# Patient Record
Sex: Male | Born: 1955 | Race: Black or African American | Hispanic: No
Health system: Southern US, Community
[De-identification: ages and names within clinical notes are randomized; demographics above are authoritative.]

---

## 2003-09-08 ENCOUNTER — Emergency Department (HOSPITAL_COMMUNITY): Admission: EM | Admit: 2003-09-08 | Discharge: 2003-09-08 | Payer: Self-pay | Admitting: Emergency Medicine

## 2003-09-24 ENCOUNTER — Emergency Department (HOSPITAL_COMMUNITY): Admission: AD | Admit: 2003-09-24 | Discharge: 2003-09-24 | Payer: Self-pay | Admitting: Family Medicine

## 2010-07-16 ENCOUNTER — Emergency Department (HOSPITAL_COMMUNITY): Admission: EM | Admit: 2010-07-16 | Discharge: 2010-07-16 | Payer: Self-pay | Admitting: Emergency Medicine

## 2014-11-01 ENCOUNTER — Ambulatory Visit: Payer: Self-pay | Admitting: Family Medicine

## 2014-11-20 ENCOUNTER — Ambulatory Visit: Payer: Self-pay | Admitting: Family Medicine

## 2014-12-05 ENCOUNTER — Ambulatory Visit: Payer: Self-pay | Admitting: Family Medicine

## 2015-12-22 ENCOUNTER — Emergency Department (HOSPITAL_COMMUNITY)
Admission: EM | Admit: 2015-12-22 | Discharge: 2015-12-22 | Disposition: A | Discharge status: Home / Self Care | Payer: Self-pay | Attending: Emergency Medicine | Admitting: Emergency Medicine

## 2015-12-22 ENCOUNTER — Emergency Department (HOSPITAL_COMMUNITY): Payer: Self-pay

## 2015-12-22 ENCOUNTER — Encounter (HOSPITAL_COMMUNITY): Payer: Self-pay | Admitting: Emergency Medicine

## 2015-12-22 DIAGNOSIS — X58XXXA Exposure to other specified factors, initial encounter: Secondary | ICD-10-CM | POA: Insufficient documentation

## 2015-12-22 DIAGNOSIS — F17200 Nicotine dependence, unspecified, uncomplicated: Secondary | ICD-10-CM | POA: Insufficient documentation

## 2015-12-22 DIAGNOSIS — Y9389 Activity, other specified: Secondary | ICD-10-CM | POA: Insufficient documentation

## 2015-12-22 DIAGNOSIS — S62522A Displaced fracture of distal phalanx of left thumb, initial encounter for closed fracture: Secondary | ICD-10-CM | POA: Insufficient documentation

## 2015-12-22 DIAGNOSIS — Y9289 Other specified places as the place of occurrence of the external cause: Secondary | ICD-10-CM | POA: Insufficient documentation

## 2015-12-22 DIAGNOSIS — Y998 Other external cause status: Secondary | ICD-10-CM | POA: Insufficient documentation

## 2015-12-22 MED ORDER — IBUPROFEN 800 MG PO TABS
800.0000 mg | ORAL_TABLET | Freq: Once | ORAL | Status: AC
  Administered 2015-12-22: 800 mg via ORAL
  Filled 2015-12-22: qty 1

## 2015-12-22 MED ORDER — IBUPROFEN 800 MG PO TABS: 800.0000 mg | ORAL_TABLET | Freq: Three times a day (TID) | ORAL | Status: DC

## 2015-12-22 NOTE — ED Provider Notes (Signed)
CSN: [id number]     Arrival date & time 12/22/15  9562 History   First MD Initiated Contact with Patient 12/22/15 0455     Chief Complaint  Patient presents with  . Thumb pain      (Consider location/radiation/quality/duration/timing/severity/associated sxs/prior Treatment) Patient is a 60 y.o. male presenting with extremity pain. The history is provided by the patient.  Extremity Pain This is a new problem. The current episode started 6 to 12 hours ago (was "gettin my drink on" last night, woke up on park bench with painful swollen left thumb). The problem occurs constantly. The problem has not changed since onset.Nothing aggravates the symptoms. Nothing relieves the symptoms. He has tried nothing for the symptoms. The treatment provided no relief.    History reviewed. No pertinent past medical history. History reviewed. No pertinent past surgical history. No family history on file. Social History  Substance Use Topics  . Smoking status: Current Every Day Smoker  . Smokeless tobacco: None  . Alcohol Use: Yes    Review of Systems  All other systems reviewed and are negative.     Allergies  Review of patient's allergies indicates no known allergies.  Home Medications   Prior to Admission medications   Not on File   BP 144/91 mmHg  Pulse 70  Temp(Src) 97.8 F (36.6 C) (Oral)  Resp 18  SpO2 99% Physical Exam  Constitutional: He is oriented to person, place, and time. He appears well-developed and well-nourished. No distress.  HENT:  Head: Normocephalic and atraumatic.  Eyes: Conjunctivae are normal.  Neck: Neck supple. No tracheal deviation present.  Cardiovascular: Normal rate and regular rhythm.   Pulmonary/Chest: Effort normal. No respiratory distress.  Abdominal: Soft. He exhibits no distension.  Musculoskeletal:       Left hand: He exhibits tenderness and swelling. He exhibits no deformity. Normal sensation noted. Normal strength noted.        Hands: Neurological: He is alert and oriented to person, place, and time.  Skin: Skin is warm and dry.  Psychiatric: He has a normal mood and affect.    ED Course  Procedures (including critical care time) Labs Review Labs Reviewed - No data to display  Imaging Review Dg Finger Thumb Left  12/22/2015  CLINICAL DATA:  Acute onset of left thumb pain.  Initial encounter. EXAM: LEFT THUMB 2+V COMPARISON:  None. FINDINGS: There is a comminuted fracture of the first distal phalanx, without significant displacement. Mild soft tissue swelling is noted. This likely extends to the interphalangeal joint. Remaining visualized joint spaces are grossly unremarkable. IMPRESSION: Comminuted fracture of the first distal phalanx, without significant displacement. This likely extends to the interphalangeal joint. Electronically Signed   By: Roanna Raider M.D.   On: 12/22/2015 04:35   I have personally reviewed and evaluated these images and lab results as part of my medical decision-making.   EKG Interpretation None      MDM   Final diagnoses:  Fracture of distal phalanx of left thumb, closed, initial encounter    60 year old male resents after a night of drinking and waking up with acute left thumb pain and he denies remembering any injury to the area but has apparently broken the distal phalanx of his thumb in the affected area. Placed in a splint here with recommendations for ice, elevation, immobilization and follow-up with hand surgery within the week for repeat evaluation. Patient is high risk demographic for narcotic pain medication due to heavy drinking so was provided NSAIDs and  recommended to perform other supportive care measures, he was agreeable to this plan. Return precautions discussed for worsening or new concerning symptoms.   Lyndal Pulleyaniel Kylinn Shropshire, MD 12/22/15 [phone]

## 2015-12-22 NOTE — Discharge Instructions (Signed)
Finger Fracture  Fractures of fingers are breaks in the bones of the fingers. There are many types of fractures. There are different ways of treating these fractures. Your health care provider will discuss the best way to treat your fracture.  CAUSES  Traumatic injury is the main cause of broken fingers. These include:  · Injuries while playing sports.  · Workplace injuries.  · Falls.  RISK FACTORS  Activities that can increase your risk of finger fractures include:  · Sports.  · Workplace activities that involve machinery.  · A condition called osteoporosis, which can make your bones less dense and cause them to fracture more easily.  SIGNS AND SYMPTOMS  The main symptoms of a broken finger are pain and swelling within 15 minutes after the injury. Other symptoms include:  · Bruising of your finger.  · Stiffness of your finger.  · Numbness of your finger.  · Exposed bones (compound fracture) if the fracture is severe.  DIAGNOSIS   The best way to diagnose a broken bone is with X-ray imaging. Additionally, your health care provider will use this X-ray image to evaluate the position of the broken finger bones.   TREATMENT   Finger fractures can be treated with:   · Nonreduction--This means the bones are in place. The finger is splinted without changing the positions of the bone pieces. The splint is usually left on for about a week to 10 days. This will depend on your fracture and what your health care provider thinks.  · Closed reduction--The bones are put back into position without using surgery. The finger is then splinted.  · Open reduction and internal fixation--The fracture site is opened. Then the bone pieces are fixed into place with pins or some type of hardware. This is seldom required. It depends on the severity of the fracture.  HOME CARE INSTRUCTIONS   · Follow your health care provider's instructions regarding activities, exercises, and physical therapy.  · Only take over-the-counter or prescription  medicines for pain, discomfort, or fever as directed by your health care provider.  SEEK MEDICAL CARE IF:  You have pain or swelling that limits the motion or use of your fingers.  SEEK IMMEDIATE MEDICAL CARE IF:   Your finger becomes numb.  MAKE SURE YOU:   · Understand these instructions.  · Will watch your condition.  · Will get help right away if you are not doing well or get worse.     This information is not intended to replace advice given to you by your health care provider. Make sure you discuss any questions you have with your health care provider.     Document Released: 03/08/2001 Document Revised: 09/14/2013 Document Reviewed: 07/06/2013  Elsevier Interactive Patient Education ©2016 [employer]

## 2015-12-22 NOTE — ED Notes (Signed)
Pt transported from park bench after calling 911 for L thumb pain onset 30 min ago, denies trauma

## 2016-01-30 ENCOUNTER — Emergency Department (HOSPITAL_COMMUNITY)
Admission: EM | Admit: 2016-01-30 | Discharge: 2016-01-31 | Disposition: A | Discharge status: Home / Self Care | Payer: Self-pay | Attending: Emergency Medicine | Admitting: Emergency Medicine

## 2016-01-30 ENCOUNTER — Encounter (HOSPITAL_COMMUNITY): Payer: Self-pay

## 2016-01-30 DIAGNOSIS — W08XXXA Fall from other furniture, initial encounter: Secondary | ICD-10-CM | POA: Insufficient documentation

## 2016-01-30 DIAGNOSIS — Y998 Other external cause status: Secondary | ICD-10-CM | POA: Insufficient documentation

## 2016-01-30 DIAGNOSIS — S0181XA Laceration without foreign body of other part of head, initial encounter: Secondary | ICD-10-CM | POA: Insufficient documentation

## 2016-01-30 DIAGNOSIS — Y9289 Other specified places as the place of occurrence of the external cause: Secondary | ICD-10-CM | POA: Insufficient documentation

## 2016-01-30 DIAGNOSIS — F17200 Nicotine dependence, unspecified, uncomplicated: Secondary | ICD-10-CM | POA: Insufficient documentation

## 2016-01-30 DIAGNOSIS — F10920 Alcohol use, unspecified with intoxication, uncomplicated: Secondary | ICD-10-CM | POA: Insufficient documentation

## 2016-01-30 DIAGNOSIS — Y9389 Activity, other specified: Secondary | ICD-10-CM | POA: Insufficient documentation

## 2016-01-30 DIAGNOSIS — W19XXXA Unspecified fall, initial encounter: Secondary | ICD-10-CM

## 2016-01-30 DIAGNOSIS — S199XXA Unspecified injury of neck, initial encounter: Secondary | ICD-10-CM | POA: Insufficient documentation

## 2016-01-30 NOTE — ED Notes (Signed)
Pt here by ems for fall from curb, known etoh use. Per ems 200 cc of blood noted to scene, pt has left ear injury and right forehead lac, pt argumentative on arrival. Pt oriented and alert, pt did have + LOC noted.

## 2016-01-30 NOTE — ED Provider Notes (Signed)
CSN: [id number]     Arrival date & time 01/30/16  2302 History  By signing my name below, I, Freida Busman, attest that this documentation has been prepared under the direction and in the presence of Azalia Bilis, MD . Electronically Signed: Freida Busman, Scribe. 01/30/2016. 11:53 PM.    Chief Complaint  Patient presents with  . Fall   LEVEL 5 CAVEAT DUE TO INEBRIATION  The history is provided by the patient. No language interpreter was used.    HPI Comments:  Robert Calderon is a 60 y.o. male who presents to the Emergency Department s/p fall this evening  complaining of facial abrasions and lacerations following the incident. Per EMS, pt fell from curb; ~200 cc's of blood noted on scene. He had LOC per EMS. Pt admits to having ETOH on board. He denies use of blood thinners.   History reviewed. No pertinent past medical history. History reviewed. No pertinent past surgical history. History reviewed. No pertinent family history. Social History  Substance Use Topics  . Smoking status: Current Every Day Smoker  . Smokeless tobacco: None  . Alcohol Use: Yes    Review of Systems  Unable to perform ROS: Other (Intoxication)   Allergies  Review of patient's allergies indicates no known allergies.  Home Medications   Prior to Admission medications   Medication Sig Start Date End Date Taking? Authorizing Provider  ibuprofen (ADVIL,MOTRIN) 800 MG tablet Take 1 tablet (800 mg total) by mouth 3 (three) times daily. Patient not taking: Reported on 01/31/2016 12/22/15   Lyndal Pulley, MD   BP 116/82 mmHg  Pulse 76  Temp(Src) 97.2 F (36.2 C) (Oral)  Resp 20  Ht  (1.854 m)  Wt 140 lb (63.504 kg)  BMI 18.47 kg/m2  SpO2 97% Physical Exam  Constitutional: He is oriented to person, place, and time. He appears well-developed and well-nourished.  HENT:  Head: Normocephalic and atraumatic.  Eyes: EOM are normal.  Neck:  Immobilized in c-collar There is cervical and paracervical  tenderness without step off  Cardiovascular: Normal rate, regular rhythm, normal heart sounds and intact distal pulses.   Pulmonary/Chest: Effort normal and breath sounds normal. No respiratory distress.  Abdominal: Soft. He exhibits no distension. There is no tenderness.  Musculoskeletal: Normal range of motion.  Nml strength in BUE and BLE  FROM of major joints in both upper and lower extremities bilaterally  Neurological: He is alert and oriented to person, place, and time.  Skin: Skin is warm and dry.  Stellate laceration right lateral forehead without bleeding at this time Small abrasion of ala left ear without active bleeding  Psychiatric: He has a normal mood and affect. Judgment normal.  Nursing note and vitals reviewed.   ED Course  Procedures   DIAGNOSTIC STUDIES:  Oxygen Saturation is 96% on RA, normal by my interpretation.    Labs Review Labs Reviewed  CBC - Abnormal; Notable for the following:    Hemoglobin 11.9 (*)    HCT 35.7 (*)    All other components within normal limits  ETHANOL - Abnormal; Notable for the following:    Alcohol, Ethyl (B) 277 (*)    All other components within normal limits    Imaging Review Ct Head Wo Contrast  01/31/2016  CLINICAL DATA:  Fall from curb with head injury. Left year injury, right forehead laceration. Positive loss of consciousness. EXAM: CT HEAD WITHOUT CONTRAST CT CERVICAL SPINE WITHOUT CONTRAST TECHNIQUE: Multidetector CT imaging of the head and cervical  spine was performed following the standard protocol without intravenous contrast. Multiplanar CT image reconstructions of the cervical spine were also generated. COMPARISON:  None. FINDINGS: CT HEAD FINDINGS No intracranial hemorrhage, mass effect, or midline shift. No hydrocephalus. The basilar cisterns are patent. No evidence of territorial infarct. No intracranial fluid collection. Right frontal scalp laceration without subjacent fracture. Calvarium is intact. Mild mucosal  thickening of the paranasal sinuses without fluid level. Segmental left zygomatic arch fracture, partially included. CT CERVICAL SPINE FINDINGS No fracture or traumatic subluxation. Straightening of normal lordosis. Diffuse degenerative change with disc space narrowing and endplate spurring. Multilevel facet arthropathy, with bony ankylosis of the facets in the upper cervical spine. The dens is intact. C1-C2 degenerative change. No prevertebral soft tissue edema. There are no jumped or perched facets. IMPRESSION: 1. No acute intracranial abnormality. Right frontal scalp laceration without fracture. 2. Segmental fracture of the left zygomatic arch, partially included. 3. Multilevel degenerative change throughout cervical spine without acute fracture or traumatic subluxation. Electronically Signed   By: Rubye Oaks M.D.   On: 01/31/2016 01:26   Ct Cervical Spine Wo Contrast  01/31/2016  CLINICAL DATA:  Fall from curb with head injury. Left year injury, right forehead laceration. Positive loss of consciousness. EXAM: CT HEAD WITHOUT CONTRAST CT CERVICAL SPINE WITHOUT CONTRAST TECHNIQUE: Multidetector CT imaging of the head and cervical spine was performed following the standard protocol without intravenous contrast. Multiplanar CT image reconstructions of the cervical spine were also generated. COMPARISON:  None. FINDINGS: CT HEAD FINDINGS No intracranial hemorrhage, mass effect, or midline shift. No hydrocephalus. The basilar cisterns are patent. No evidence of territorial infarct. No intracranial fluid collection. Right frontal scalp laceration without subjacent fracture. Calvarium is intact. Mild mucosal thickening of the paranasal sinuses without fluid level. Segmental left zygomatic arch fracture, partially included. CT CERVICAL SPINE FINDINGS No fracture or traumatic subluxation. Straightening of normal lordosis. Diffuse degenerative change with disc space narrowing and endplate spurring. Multilevel facet  arthropathy, with bony ankylosis of the facets in the upper cervical spine. The dens is intact. C1-C2 degenerative change. No prevertebral soft tissue edema. There are no jumped or perched facets. IMPRESSION: 1. No acute intracranial abnormality. Right frontal scalp laceration without fracture. 2. Segmental fracture of the left zygomatic arch, partially included. 3. Multilevel degenerative change throughout cervical spine without acute fracture or traumatic subluxation. Electronically Signed   By: Rubye Oaks M.D.   On: 01/31/2016 01:26   I have personally reviewed and evaluated these images and lab results as part of my medical decision-making.  2:03 AM LACERATION REPAIR PROCEDURE NOTE The patient's identification was confirmed and consent was obtained. This procedure was performed by Azalia Bilis, MD at 2:03 AM. Site: right lateral forehead Sterile procedures observed Suture type/size:Dermabond Length:right forehead Complexity.simple irrigated with NS, explored without evidence of foreign body, wound well approximated Patient tolerated procedure well without complications. Instructions for care discussed verbally and patient provided with additional written instructions for homecare and f/u.  MDM   Final diagnoses:  Fall, initial encounter  Alcohol intoxication, uncomplicated (HCC)  Forehead laceration, initial encounter    Mechanical fall. Ambulatory in ER. Infection warnings given. Dc home in good condition  I personally performed the services described in this documentation, which was scribed in my presence. The recorded information has been reviewed and is accurate.         Azalia Bilis, MD 01/31/16 [phone]

## 2016-01-31 ENCOUNTER — Encounter (HOSPITAL_COMMUNITY): Payer: Self-pay | Admitting: Radiology

## 2016-01-31 ENCOUNTER — Emergency Department (HOSPITAL_COMMUNITY): Payer: Self-pay

## 2016-01-31 LAB — CBC
HEMATOCRIT: 35.7 % — AB (ref 39.0–52.0)
Hemoglobin: 11.9 g/dL — ABNORMAL LOW (ref 13.0–17.0)
MCH: 27.7 pg (ref 26.0–34.0)
MCHC: 33.3 g/dL (ref 30.0–36.0)
MCV: 83.2 fL (ref 78.0–100.0)
Platelets: 195 10*3/uL (ref 150–400)
RBC: 4.29 MIL/uL (ref 4.22–5.81)
RDW: 12.3 % (ref 11.5–15.5)
WBC: 5.3 10*3/uL (ref 4.0–10.5)

## 2016-01-31 LAB — ETHANOL: ALCOHOL ETHYL (B): 277 mg/dL — AB (ref <5)

## 2016-01-31 MED ORDER — ACETAMINOPHEN 325 MG PO TABS
650.0000 mg | ORAL_TABLET | Freq: Once | ORAL | Status: AC
  Administered 2016-01-31: 650 mg via ORAL
  Filled 2016-01-31: qty 2

## 2016-01-31 NOTE — ED Notes (Signed)
Pt stable, ambulatory, states understanding of discharge instructions 

## 2016-01-31 NOTE — Discharge Instructions (Signed)
Tissue Adhesive Wound Care  ° °Some cuts, wounds, lacerations, and incisions can be repaired by using tissue adhesive. Tissue adhesive is like glue. It holds the skin together, allowing for faster healing. It forms a strong bond on the skin in about 1 minute and reaches its full strength in about 2 or 3 minutes. The adhesive disappears naturally while the wound is healing. It is important to take proper care of your wound at home while it heals.  °HOME CARE INSTRUCTIONS  °Showers are allowed. Do not soak the area containing the tissue adhesive. Do not take baths, swim, or use hot tubs. Do not use any soaps or ointments on the wound. Certain ointments can weaken the glue.  °If a bandage (dressing) has been applied, follow your health care provider's instructions for how often to change the dressing.  °Keep the dressing dry if one has been applied.  °Do not scratch, pick, or rub the adhesive.  °Do not place tape over the adhesive. The adhesive could come off when pulling the tape off.  °Protect the wound from further injury until it is healed.  °Protect the wound from sun and tanning bed exposure while it is healing and for several weeks after healing.  °Only take over-the-counter or prescription medicines as directed by your health care provider.  °Keep all follow-up appointments as directed by your health care provider. °SEEK IMMEDIATE MEDICAL CARE IF:  °Your wound becomes red, swollen, hot, or tender.  °You develop a rash after the glue is applied.  °You have increasing pain in the wound.  °You have a red streak that goes away from the wound.  °You have pus coming from the wound.  °You have increased bleeding.  °You have a fever.  °You have shaking chills.  °You notice a bad smell coming from the wound.  °Your wound or adhesive breaks open.  °MAKE SURE YOU:  °Understand these instructions.  °Will watch your condition.  °Will get help right away if you are not doing well or get worse. °This information is not  intended to replace advice given to you by your health care provider. Make sure you discuss any questions you have with your health care provider.  °Document Released: 05/20/2001 Document Revised: 09/14/2013 Document Reviewed: 06/15/2013  °Elsevier Interactive Patient Education ©2016 [employer]  ° °

## 2016-04-12 ENCOUNTER — Encounter (HOSPITAL_COMMUNITY): Payer: Self-pay

## 2016-04-12 ENCOUNTER — Emergency Department (HOSPITAL_COMMUNITY): Payer: Self-pay

## 2016-04-12 ENCOUNTER — Emergency Department (HOSPITAL_COMMUNITY)
Admission: EM | Admit: 2016-04-12 | Discharge: 2016-04-12 | Discharge status: Against Medical Advice | Payer: Self-pay | Attending: Emergency Medicine | Admitting: Emergency Medicine

## 2016-04-12 DIAGNOSIS — F17200 Nicotine dependence, unspecified, uncomplicated: Secondary | ICD-10-CM | POA: Insufficient documentation

## 2016-04-12 DIAGNOSIS — Y9389 Activity, other specified: Secondary | ICD-10-CM | POA: Insufficient documentation

## 2016-04-12 DIAGNOSIS — Y9289 Other specified places as the place of occurrence of the external cause: Secondary | ICD-10-CM | POA: Insufficient documentation

## 2016-04-12 DIAGNOSIS — S01111A Laceration without foreign body of right eyelid and periocular area, initial encounter: Secondary | ICD-10-CM | POA: Insufficient documentation

## 2016-04-12 DIAGNOSIS — S0181XA Laceration without foreign body of other part of head, initial encounter: Secondary | ICD-10-CM

## 2016-04-12 DIAGNOSIS — W01198A Fall on same level from slipping, tripping and stumbling with subsequent striking against other object, initial encounter: Secondary | ICD-10-CM | POA: Insufficient documentation

## 2016-04-12 DIAGNOSIS — F1010 Alcohol abuse, uncomplicated: Secondary | ICD-10-CM | POA: Insufficient documentation

## 2016-04-12 DIAGNOSIS — Y998 Other external cause status: Secondary | ICD-10-CM | POA: Insufficient documentation

## 2016-04-12 MED ORDER — LIDOCAINE-EPINEPHRINE (PF) 2 %-1:200000 IJ SOLN
20.0000 mL | Freq: Once | INTRAMUSCULAR | Status: AC
  Administered 2016-04-12: 20 mL
  Filled 2016-04-12: qty 20

## 2016-04-12 NOTE — ED Notes (Signed)
PA at bedside.

## 2016-04-12 NOTE — ED Provider Notes (Signed)
CSN: [id number]     Arrival date & time 04/12/16  1734 History   First MD Initiated Contact with Patient 04/12/16 1744     Chief Complaint  Patient presents with  . Head Laceration     (Consider location/radiation/quality/duration/timing/severity/associated sxs/prior Treatment) HPI Comments: Patient here complaining of laceration to the right eyebrow after he fell. He is unsure of this happened and admits to drinking copious amount of alcohol today. Denies any neck pain. Is unsure if he lost consciousness. Bleeding has been controlled with direct pressure. Denies any right eye visual changes or eye pain. No weakness in his arms or legs. EMS called and patient transported here.  Patient is a 60 y.o. male presenting with scalp laceration. The history is provided by the patient.  Head Laceration    History reviewed. No pertinent past medical history. No past surgical history on file. No family history on file. Social History  Substance Use Topics  . Smoking status: Current Every Day Smoker  . Smokeless tobacco: None  . Alcohol Use: Yes    Review of Systems  All other systems reviewed and are negative.     Allergies  Review of patient's allergies indicates no known allergies.  Home Medications   Prior to Admission medications   Not on File   BP 108/87 mmHg  Pulse 68  Temp(Src) 98.2 F (36.8 C) (Oral)  Resp 18  SpO2 99% Physical Exam  Constitutional: He is oriented to person, place, and time. He appears well-developed and well-nourished.  Non-toxic appearance. No distress.  HENT:  Head: Normocephalic and atraumatic.    Eyes: Conjunctivae, EOM and lids are normal. Pupils are equal, round, and reactive to light.  Neck: Normal range of motion. Neck supple. No tracheal deviation present. No thyroid mass present.  Cardiovascular: Normal rate, regular rhythm and normal heart sounds.  Exam reveals no gallop.   No murmur heard. Pulmonary/Chest: Effort normal and breath  sounds normal. No stridor. No respiratory distress. He has no decreased breath sounds. He has no wheezes. He has no rhonchi. He has no rales.  Abdominal: Soft. Normal appearance and bowel sounds are normal. He exhibits no distension. There is no tenderness. There is no rebound and no CVA tenderness.  Musculoskeletal: Normal range of motion. He exhibits no edema or tenderness.  Neurological: He is alert and oriented to person, place, and time. He has normal strength. No cranial nerve deficit or sensory deficit. GCS eye subscore is 4. GCS verbal subscore is 5. GCS motor subscore is 6.  Skin: Skin is warm and dry. No abrasion and no rash noted.  Psychiatric: He has a normal mood and affect. His speech is normal and behavior is normal.  Nursing note and vitals reviewed.   ED Course  Procedures (including critical care time) Labs Review Labs Reviewed - No data to display  Imaging Review No results found. I have personally reviewed and evaluated these images and lab results as part of my medical decision-making.   EKG Interpretation None      MDM   Final diagnoses:  None    Laceration repaired by physician assistant. Evidence of intracranial or cervical spine injury. Stable for discharge when sober    Lorre NickAnthony Vlad Mayberry, MD 04/12/16 [phone]

## 2016-04-12 NOTE — ED Provider Notes (Signed)
LACERATION REPAIR Performed by: Dub MikesSamantha Tripp Kiefer Opheim Authorized by: Dub MikesSamantha Tripp Alondra Sahni Consent: Verbal consent obtained. Risks and benefits: risks, benefits and alternatives were discussed Consent given by: patient Patient identity confirmed: provided demographic data Prepped and Draped in normal sterile fashion Wound explored  Laceration Location: superior to R eyebrow  Laceration Length: 2cm  No Foreign Bodies seen or palpated  Anesthesia: local infiltration  Local anesthetic: lidocaine 2% with epinephrine  Anesthetic total: 3 ml  Irrigation method: syringe Amount of cleaning: standard  Skin closure: approximated  Number of sutures: 3  Technique: simple interrupted  Patient tolerance: Patient tolerated the procedure well with no immediate complications.   Lester KinsmanSamantha Tripp [place], PA-C 04/12/16 1923  Lorre NickAnthony Allen, MD 04/20/16 [phone]

## 2016-04-12 NOTE — ED Notes (Signed)
Patient was d/c without vitals.

## 2016-04-12 NOTE — ED Notes (Signed)
Bed: [id number] Expected date:  Expected time:  Means of arrival:  Comments: EMS- ETOH, fall with head lac

## 2016-04-12 NOTE — ED Notes (Signed)
Per EMS report: pt has been drinking all day.  Pt tripped and hit his head. EMS noted a 1 inch laceration over pt's right eye.  Pt a/o x 4.

## 2016-04-12 NOTE — ED Notes (Signed)
Patient ambulatory about the department requesting to be discharged. Patient states "I have to catch the bus". Patient was initially redirectable to his room, but then continued to walk to the nurses desk. Spoke to Dr Freida BusmanAllen to advise patient was ambulatory and requesting discharge.   Patient then walked to the desk and said "I have to catch the bus" and walked out. Patient speech clear at discharge, gait steady.

## 2016-04-12 NOTE — ED Notes (Signed)
He states he's been "drinking all day and I fell".  He has a lac. Above right eyebrow/orbit area.  He is alert and in no distress.  He is occasionally profane and is redirectable and follows instructions reasonably well.

## 2019-03-03 ENCOUNTER — Encounter (HOSPITAL_COMMUNITY): Payer: Self-pay

## 2019-03-03 ENCOUNTER — Inpatient Hospital Stay (HOSPITAL_COMMUNITY)
Admission: EM | Admit: 2019-03-03 | Discharge: 2019-03-07 | DRG: 494 | Disposition: A | Discharge status: Home Health | Payer: Self-pay | Attending: Orthopaedic Surgery | Admitting: Orthopaedic Surgery

## 2019-03-03 ENCOUNTER — Emergency Department (HOSPITAL_COMMUNITY): Payer: Self-pay

## 2019-03-03 DIAGNOSIS — W109XXA Fall (on) (from) unspecified stairs and steps, initial encounter: Secondary | ICD-10-CM | POA: Diagnosis present

## 2019-03-03 DIAGNOSIS — Y9289 Other specified places as the place of occurrence of the external cause: Secondary | ICD-10-CM

## 2019-03-03 DIAGNOSIS — S82241A Displaced spiral fracture of shaft of right tibia, initial encounter for closed fracture: Secondary | ICD-10-CM

## 2019-03-03 DIAGNOSIS — F17200 Nicotine dependence, unspecified, uncomplicated: Secondary | ICD-10-CM | POA: Diagnosis present

## 2019-03-03 DIAGNOSIS — S82201A Unspecified fracture of shaft of right tibia, initial encounter for closed fracture: Secondary | ICD-10-CM

## 2019-03-03 DIAGNOSIS — Y908 Blood alcohol level of 240 mg/100 ml or more: Secondary | ICD-10-CM | POA: Diagnosis present

## 2019-03-03 DIAGNOSIS — F10920 Alcohol use, unspecified with intoxication, uncomplicated: Secondary | ICD-10-CM

## 2019-03-03 DIAGNOSIS — F10120 Alcohol abuse with intoxication, uncomplicated: Secondary | ICD-10-CM | POA: Diagnosis present

## 2019-03-03 DIAGNOSIS — Z419 Encounter for procedure for purposes other than remedying health state, unspecified: Secondary | ICD-10-CM

## 2019-03-03 DIAGNOSIS — S82401A Unspecified fracture of shaft of right fibula, initial encounter for closed fracture: Secondary | ICD-10-CM

## 2019-03-03 DIAGNOSIS — S82451A Displaced comminuted fracture of shaft of right fibula, initial encounter for closed fracture: Principal | ICD-10-CM

## 2019-03-03 MED ORDER — FENTANYL CITRATE (PF) 100 MCG/2ML IJ SOLN
25.0000 ug | Freq: Once | INTRAMUSCULAR | Status: AC
  Administered 2019-03-03: 25 ug via INTRAVENOUS
  Filled 2019-03-03: qty 2

## 2019-03-03 MED ORDER — HYDROMORPHONE HCL 1 MG/ML IJ SOLN
1.0000 mg | Freq: Once | INTRAMUSCULAR | Status: AC
  Administered 2019-03-04: 1 mg via INTRAVENOUS
  Filled 2019-03-03: qty 1

## 2019-03-03 NOTE — ED Provider Notes (Signed)
[facility] EMERGENCY DEPARTMENT Provider Note   CSN: [id number] Arrival date & time: 03/03/19  2228    History   Chief Complaint Chief Complaint  Patient presents with   Fall    HPI Robert Calderon is a 63 y.o. male.    63 year old male with no known past medical history presents to the emergency department via EMS for pain and deformity to the right shin.  [place] and [place] causing pain to his right lower extremity.  Pain has been constant, unchanged.  Denies striking his head or losing consciousness.  He has had two 40 ounce beers today.  Endorses regular alcohol consumption, but denies drug use.  Is not on chronic anticoagulation.  No medications received for pain prior to arrival.  States that he lives in a fourth floor apartment with his sister.  The history is provided by the patient. No language interpreter was used.  Fall     History reviewed. No pertinent past medical history.  Patient Active Problem List   Diagnosis Date Noted   Tibia/fibula fracture 03/04/2019    History reviewed. No pertinent surgical history.      Home Medications    Prior to Admission medications   Not on File    Family History History reviewed. No pertinent family history.  Social History Social History   Tobacco Use   Smoking status: Current Every Day Smoker   Smokeless tobacco: Never Used  Substance Use Topics   Alcohol use: Yes   Drug use: No     Allergies   Patient has no known allergies.   Review of Systems Review of Systems Ten systems reviewed and are negative for acute change, except as noted in the HPI.    Physical Exam Updated Vital Signs BP 140/89    Pulse 64    Temp (!) 97.5 F (36.4 C) (Oral)    Resp 15    Ht  (1.854 m)    Wt 70.3 kg    SpO2 94%    BMI 20.45 kg/m   Physical Exam Vitals signs and nursing note reviewed.  Constitutional:    General: He is not in acute distress.    Appearance: He is well-developed. He is not diaphoretic.     Comments: Nontoxic appearing and pleasant.  HENT:     Head: Normocephalic and atraumatic.  Eyes:     General: No scleral icterus.    Conjunctiva/sclera: Conjunctivae normal.  Neck:     Comments: Cervical collar in place Cardiovascular:     Rate and Rhythm: Normal rate and regular rhythm.     Pulses: Normal pulses.     Comments: DP pulse 2+ in the right lower extremity.  Capillary refill brisk in all digits of the right foot. Pulmonary:     Effort: Pulmonary effort is normal. No respiratory distress.     Comments: Respirations even and unlabored Musculoskeletal:        General: Tenderness and deformity present.     Right lower leg: He exhibits tenderness, bony tenderness, swelling and deformity.       Legs:     Comments: Compartments of the distal RLE are soft.  Skin:    General: Skin is warm and dry.     Coloration: Skin is not pale.     Findings: No erythema or rash.  Neurological:     Mental Status: He is alert and  oriented to person, place, and time.     Comments: Sensation to light touch intact in the right lower extremity.  Patient able to wiggle all toes of the right foot.  Psychiatric:        Behavior: Behavior normal.      ED Treatments / Results  Labs (all labs ordered are listed, but only abnormal results are displayed) Labs Reviewed  ETHANOL - Abnormal; Notable for the following components:      Result Value   Alcohol, Ethyl (B) 350 (*)    All other components within normal limits  RAPID URINE DRUG SCREEN, HOSP PERFORMED - Abnormal; Notable for the following components:   Cocaine POSITIVE (*)    All other components within normal limits  BASIC METABOLIC PANEL - Abnormal; Notable for the following components:   Sodium 134 (*)    Glucose, Bld 141 (*)    BUN 5 (*)    Calcium 8.6 (*)    All other components within normal limits  CBC     EKG None  Radiology Dg Tibia/fibula Right  Result Date: 03/03/2019 CLINICAL DATA:  Fall down concrete stairs with right lower extremity pain and deformity. EXAM: RIGHT TIBIA AND FIBULA - 2 VIEW COMPARISON:  None. FINDINGS: Spiral distal tibial shaft fracture has 1 corner shaft with lateral displacement of distal fracture fragment. Comminuted displaced proximal fibular shaft fracture has 1/2 shaft with lateral displacement of dominant fracture fragment. Neither fracture extends to the articular surface of the knee or ankle. Knee and ankle alignment is maintained. IMPRESSION: 1. Spiral distal tibial shaft fracture with lateral displacement of distal fracture fragment. 2. Comminuted displaced proximal fibular shaft fracture. Electronically Signed   By: Narda Rutherford M.D.   On: 03/03/2019 23:16   Ct Cervical Spine Wo Contrast  Result Date: 03/04/2019 CLINICAL DATA:  Initial evaluation for acute trauma, fall. EXAM: CT CERVICAL SPINE WITHOUT CONTRAST TECHNIQUE: Multidetector CT imaging of the cervical spine was performed without intravenous contrast. Multiplanar CT image reconstructions were also generated. COMPARISON:  Prior CT from 04/12/2016. FINDINGS: Alignment: Straightening with slight reversal of the normal cervical lordosis. Trace anterolisthesis of C3 on C4, likely chronic and facet mediated. No malalignment. Skull base and vertebrae: Skull base intact. Normal C1-2 articulations are preserved in the dens is intact. Vertebral body heights maintained. No acute fracture. Soft tissues and spinal canal: Soft tissues of the neck demonstrate no acute finding. No abnormal prevertebral edema. Vascular calcifications noted about the carotid bifurcations. Spinal canal demonstrates no acute finding. Disc levels: Moderate multilevel cervical spondylolysis, most notable at C5-6 and C6-7. Advanced multilevel facet arthrosis with ankylosis seen on the left at C2-3 through C4-5, and on the right at C2-3 and  C3-4. Prominent degenerative changes about the C1-2 articulation and skull base. Upper chest: Visualized upper chest demonstrates no acute finding. Visualized lung apices are clear. Centrilobular and paraseptal emphysematous changes noted. Other: None. IMPRESSION: 1. No acute traumatic injury within the cervical spine. 2. Moderate multilevel cervical spondylolysis, most notable at C5-6 and C6-7. 3. 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 4. Emphysema. Electronically Signed   By: Rise Mu M.D.   On: 03/04/2019 00:32    Procedures Procedures (including critical care time)  Medications Ordered in ED Medications  0.9 %  sodium chloride infusion (has no administration in time range)  HYDROmorphone (DILAUDID) injection 1 mg (has no administration in time range)  ondansetron (ZOFRAN) injection 4 mg (has no administration in  time range)  fentaNYL (SUBLIMAZE) injection 25 mcg (25 mcg Intravenous Given 03/03/19 2330)  HYDROmorphone (DILAUDID) injection 1 mg (1 mg Intravenous Given 03/04/19 0022)    11:29 PM Consult placed to Orthopedics to discuss imaging results.  11:43 PM Dr. August Saucer requests temporary admission orders and long leg splint with continued elevation of the RLE. Orthopedics to evaluate in the AM with plans to repair tomorrow (03/04/2019).  1:42 AM Admission orders placed following results of CBC/BMP.  No leukocytosis or electrolyte derangements.  Given that patient has an alcohol level of 350, telemetry bed requested to ensure no signs of acute withdrawal.  CIWA orders placed.  1:51 AM Patient with borderline low SpO2 when sleeping. Will improve to 94-100% when awake. He remains neurovascularly intact in the RLE post splinting; normal capillary refill and sensation.  Wiggling all toes.  Patient denies tremors when he does not drink. Further denies a hx of seizures if/when withdrawing from alcohol.    Initial  Impression / Assessment and Plan / ED Course  I have reviewed the triage vital signs and the nursing notes.  Pertinent labs & imaging results that were available during my care of the patient were reviewed by me and considered in my medical decision making (see chart for details).        63 year old male [place] emergency [place].  Is clinically intoxicated with an alcohol of 350.  Found to have a spiral tibial shaft fracture as well as a proximal fibular fracture.  He was placed in a posterior long-leg and stirrup splint.  Extremity has been elevated.  Will admit to orthopedic service for surgical repair in the morning.  Patient NPO.  Hemodynamically stable in the ED.  Dr. August Saucer to admit.   Final Clinical Impressions(s) / ED Diagnoses   Final diagnoses:  Closed displaced spiral fracture of shaft of right tibia, initial encounter  Closed displaced comminuted fracture of shaft of right fibula, initial encounter  Alcoholic intoxication without complication [facility])    ED Discharge Orders    None       Antony Madura, PA-C 03/04/19 0154    Mesner, Barbara Cower, MD 03/07/19 [id number]

## 2019-03-03 NOTE — ED Triage Notes (Signed)
Pt presents with deformity to R shin after falling down concrete stairs PTA.  +ETOH, -LOC

## 2019-03-04 ENCOUNTER — Inpatient Hospital Stay (HOSPITAL_COMMUNITY): Payer: Self-pay | Admitting: Certified Registered Nurse Anesthetist

## 2019-03-04 ENCOUNTER — Inpatient Hospital Stay (HOSPITAL_COMMUNITY): Payer: Self-pay

## 2019-03-04 ENCOUNTER — Encounter (HOSPITAL_COMMUNITY)
Admission: EM | Disposition: A | Discharge status: Home Health | Payer: Self-pay | Source: Home / Self Care | Attending: Orthopaedic Surgery

## 2019-03-04 ENCOUNTER — Other Ambulatory Visit: Payer: Self-pay

## 2019-03-04 ENCOUNTER — Emergency Department (HOSPITAL_COMMUNITY): Payer: Self-pay

## 2019-03-04 ENCOUNTER — Encounter (HOSPITAL_COMMUNITY): Payer: Self-pay | Admitting: Certified Registered Nurse Anesthetist

## 2019-03-04 DIAGNOSIS — S82201A Unspecified fracture of shaft of right tibia, initial encounter for closed fracture: Secondary | ICD-10-CM

## 2019-03-04 DIAGNOSIS — S82401A Unspecified fracture of shaft of right fibula, initial encounter for closed fracture: Secondary | ICD-10-CM | POA: Diagnosis present

## 2019-03-04 HISTORY — PX: TIBIA IM NAIL INSERTION: SHX2516

## 2019-03-04 LAB — BASIC METABOLIC PANEL
Anion gap: 10 (ref 5–15)
BUN: 5 mg/dL — ABNORMAL LOW (ref 8–23)
CHLORIDE: 100 mmol/L (ref 98–111)
CO2: 24 mmol/L (ref 22–32)
Calcium: 8.6 mg/dL — ABNORMAL LOW (ref 8.9–10.3)
Creatinine, Ser: 0.94 mg/dL (ref 0.61–1.24)
GFR calc Af Amer: >60 mL/min (ref >60)
GFR calc non Af Amer: >60 mL/min (ref >60)
Glucose, Bld: 141 mg/dL — ABNORMAL HIGH (ref 70–99)
Potassium: 4.4 mmol/L (ref 3.5–5.1)
Sodium: 134 mmol/L — ABNORMAL LOW (ref 135–145)

## 2019-03-04 LAB — RAPID URINE DRUG SCREEN, HOSP PERFORMED
Amphetamines: NOT DETECTED
BARBITURATES: NOT DETECTED
Benzodiazepines: NOT DETECTED
Cocaine: POSITIVE — AB
Opiates: NOT DETECTED
TETRAHYDROCANNABINOL: NOT DETECTED

## 2019-03-04 LAB — CBC
HEMATOCRIT: 44.9 % (ref 39.0–52.0)
Hemoglobin: 15 g/dL (ref 13.0–17.0)
MCH: 28.7 pg (ref 26.0–34.0)
MCHC: 33.4 g/dL (ref 30.0–36.0)
MCV: 85.9 fL (ref 80.0–100.0)
NRBC: 0 % (ref 0.0–0.2)
Platelets: 223 10*3/uL (ref 150–400)
RBC: 5.23 MIL/uL (ref 4.22–5.81)
RDW: 13.2 % (ref 11.5–15.5)
WBC: 4.6 10*3/uL (ref 4.0–10.5)

## 2019-03-04 LAB — ETHANOL: Alcohol, Ethyl (B): 350 mg/dL (ref <10)

## 2019-03-04 LAB — MRSA PCR SCREENING: MRSA by PCR: NEGATIVE

## 2019-03-04 SURGERY — INSERTION, INTRAMEDULLARY ROD, TIBIA
Anesthesia: General | Site: Leg Lower | Laterality: Right

## 2019-03-04 MED ORDER — OXYCODONE HCL 5 MG PO TABS
10.0000 mg | ORAL_TABLET | ORAL | Status: DC | PRN
  Administered 2019-03-04: 10 mg via ORAL

## 2019-03-04 MED ORDER — ADULT MULTIVITAMIN W/MINERALS CH
1.0000 | ORAL_TABLET | Freq: Every day | ORAL | Status: DC
  Administered 2019-03-05 – 2019-03-07 (×3): 1 via ORAL
  Filled 2019-03-04 (×3): qty 1

## 2019-03-04 MED ORDER — SUCCINYLCHOLINE CHLORIDE 200 MG/10ML IV SOSY
PREFILLED_SYRINGE | INTRAVENOUS | Status: DC | PRN
  Administered 2019-03-04: 100 mg via INTRAVENOUS

## 2019-03-04 MED ORDER — LIDOCAINE 2% (20 MG/ML) 5 ML SYRINGE
INTRAMUSCULAR | Status: AC
  Filled 2019-03-04: qty 5

## 2019-03-04 MED ORDER — DOCUSATE SODIUM 100 MG PO CAPS
100.0000 mg | ORAL_CAPSULE | Freq: Two times a day (BID) | ORAL | Status: DC
  Administered 2019-03-04 – 2019-03-06 (×5): 100 mg via ORAL
  Filled 2019-03-04 (×6): qty 1

## 2019-03-04 MED ORDER — CEFAZOLIN SODIUM-DEXTROSE 2-4 GM/100ML-% IV SOLN
2.0000 g | INTRAVENOUS | Status: AC
  Administered 2019-03-04: 2 g via INTRAVENOUS
  Filled 2019-03-04: qty 100

## 2019-03-04 MED ORDER — SUCCINYLCHOLINE CHLORIDE 200 MG/10ML IV SOSY
PREFILLED_SYRINGE | INTRAVENOUS | Status: AC
  Filled 2019-03-04: qty 10

## 2019-03-04 MED ORDER — LORAZEPAM 1 MG PO TABS
1.0000 mg | ORAL_TABLET | Freq: Four times a day (QID) | ORAL | Status: AC | PRN
  Administered 2019-03-04: 1 mg via ORAL
  Filled 2019-03-04: qty 1

## 2019-03-04 MED ORDER — ROCURONIUM BROMIDE 10 MG/ML (PF) SYRINGE
PREFILLED_SYRINGE | INTRAVENOUS | Status: DC | PRN
  Administered 2019-03-04: 50 mg via INTRAVENOUS

## 2019-03-04 MED ORDER — DIPHENHYDRAMINE HCL 12.5 MG/5ML PO ELIX: 25.0000 mg | ORAL_SOLUTION | ORAL | Status: DC | PRN

## 2019-03-04 MED ORDER — HYDROMORPHONE HCL 1 MG/ML IJ SOLN: 0.5000 mg | INTRAMUSCULAR | Status: DC | PRN

## 2019-03-04 MED ORDER — METOCLOPRAMIDE HCL 5 MG PO TABS: 5.0000 mg | ORAL_TABLET | Freq: Three times a day (TID) | ORAL | Status: DC | PRN

## 2019-03-04 MED ORDER — FENTANYL CITRATE (PF) 100 MCG/2ML IJ SOLN
INTRAMUSCULAR | Status: AC
  Filled 2019-03-04: qty 2

## 2019-03-04 MED ORDER — THIAMINE HCL 100 MG/ML IJ SOLN: 100.0000 mg | Freq: Every day | INTRAMUSCULAR | Status: DC

## 2019-03-04 MED ORDER — SUGAMMADEX SODIUM 200 MG/2ML IV SOLN
INTRAVENOUS | Status: DC | PRN
  Administered 2019-03-04: 200 mg via INTRAVENOUS

## 2019-03-04 MED ORDER — ACETAMINOPHEN 500 MG PO TABS
1000.0000 mg | ORAL_TABLET | Freq: Four times a day (QID) | ORAL | Status: AC
  Administered 2019-03-04 – 2019-03-05 (×4): 1000 mg via ORAL
  Filled 2019-03-04 (×3): qty 2

## 2019-03-04 MED ORDER — MIDAZOLAM HCL 2 MG/2ML IJ SOLN
INTRAMUSCULAR | Status: AC
  Filled 2019-03-04: qty 2

## 2019-03-04 MED ORDER — PHENYLEPHRINE 40 MCG/ML (10ML) SYRINGE FOR IV PUSH (FOR BLOOD PRESSURE SUPPORT)
PREFILLED_SYRINGE | INTRAVENOUS | Status: DC | PRN
  Administered 2019-03-04: 80 ug via INTRAVENOUS
  Administered 2019-03-04: 120 ug via INTRAVENOUS
  Administered 2019-03-04: 80 ug via INTRAVENOUS

## 2019-03-04 MED ORDER — OXYCODONE HCL 5 MG PO TABS: 5.0000 mg | ORAL_TABLET | Freq: Once | ORAL | Status: DC | PRN

## 2019-03-04 MED ORDER — MAGNESIUM CITRATE PO SOLN: 1.0000 | Freq: Once | ORAL | Status: DC | PRN

## 2019-03-04 MED ORDER — DEXAMETHASONE SODIUM PHOSPHATE 10 MG/ML IJ SOLN
INTRAMUSCULAR | Status: AC
  Filled 2019-03-04: qty 1

## 2019-03-04 MED ORDER — ONDANSETRON HCL 4 MG/2ML IJ SOLN: 4.0000 mg | Freq: Four times a day (QID) | INTRAMUSCULAR | Status: DC | PRN

## 2019-03-04 MED ORDER — HYDROMORPHONE HCL 1 MG/ML IJ SOLN
1.0000 mg | INTRAMUSCULAR | Status: AC | PRN
  Administered 2019-03-04 (×2): 1 mg via INTRAVENOUS
  Filled 2019-03-04 (×2): qty 1

## 2019-03-04 MED ORDER — ONDANSETRON HCL 4 MG PO TABS: 4.0000 mg | ORAL_TABLET | Freq: Four times a day (QID) | ORAL | Status: DC | PRN

## 2019-03-04 MED ORDER — FENTANYL CITRATE (PF) 100 MCG/2ML IJ SOLN
25.0000 ug | INTRAMUSCULAR | Status: DC | PRN
  Administered 2019-03-04: 25 ug via INTRAVENOUS
  Administered 2019-03-04: 50 ug via INTRAVENOUS

## 2019-03-04 MED ORDER — ROCURONIUM BROMIDE 50 MG/5ML IV SOSY
PREFILLED_SYRINGE | INTRAVENOUS | Status: AC
  Filled 2019-03-04: qty 5

## 2019-03-04 MED ORDER — SORBITOL 70 % SOLN: 30.0000 mL | Freq: Every day | Status: DC | PRN

## 2019-03-04 MED ORDER — FENTANYL CITRATE (PF) 250 MCG/5ML IJ SOLN
INTRAMUSCULAR | Status: DC | PRN
  Administered 2019-03-04 (×5): 50 ug via INTRAVENOUS

## 2019-03-04 MED ORDER — ASPIRIN 81 MG PO CHEW
81.0000 mg | CHEWABLE_TABLET | Freq: Two times a day (BID) | ORAL | Status: DC
  Administered 2019-03-04 – 2019-03-07 (×6): 81 mg via ORAL
  Filled 2019-03-04 (×6): qty 1

## 2019-03-04 MED ORDER — DEXAMETHASONE SODIUM PHOSPHATE 10 MG/ML IJ SOLN
INTRAMUSCULAR | Status: DC | PRN
  Administered 2019-03-04: 5 mg via INTRAVENOUS

## 2019-03-04 MED ORDER — ACETAMINOPHEN 325 MG PO TABS: 325.0000 mg | ORAL_TABLET | Freq: Four times a day (QID) | ORAL | Status: DC | PRN

## 2019-03-04 MED ORDER — OXYCODONE HCL 5 MG/5ML PO SOLN: 5.0000 mg | Freq: Once | ORAL | Status: DC | PRN

## 2019-03-04 MED ORDER — SODIUM CHLORIDE 0.9 % IV SOLN
INTRAVENOUS | Status: DC | PRN
  Administered 2019-03-04: 30 ug/min via INTRAVENOUS

## 2019-03-04 MED ORDER — LACTATED RINGERS IV SOLN
INTRAVENOUS | Status: DC | PRN
  Administered 2019-03-04 (×2): via INTRAVENOUS

## 2019-03-04 MED ORDER — CEFAZOLIN SODIUM-DEXTROSE 2-4 GM/100ML-% IV SOLN
2.0000 g | Freq: Four times a day (QID) | INTRAVENOUS | Status: AC
  Administered 2019-03-04 – 2019-03-05 (×3): 2 g via INTRAVENOUS
  Filled 2019-03-04 (×3): qty 100

## 2019-03-04 MED ORDER — SODIUM CHLORIDE 0.9 % IV SOLN: INTRAVENOUS | Status: DC

## 2019-03-04 MED ORDER — FOLIC ACID 1 MG PO TABS
1.0000 mg | ORAL_TABLET | Freq: Every day | ORAL | Status: DC
  Administered 2019-03-05 – 2019-03-07 (×3): 1 mg via ORAL
  Filled 2019-03-04 (×3): qty 1

## 2019-03-04 MED ORDER — OXYCODONE HCL ER 10 MG PO T12A
10.0000 mg | EXTENDED_RELEASE_TABLET | Freq: Two times a day (BID) | ORAL | Status: DC
  Administered 2019-03-04 – 2019-03-07 (×6): 10 mg via ORAL
  Filled 2019-03-04 (×6): qty 1

## 2019-03-04 MED ORDER — VITAMIN B-1 100 MG PO TABS
100.0000 mg | ORAL_TABLET | Freq: Every day | ORAL | Status: DC
  Administered 2019-03-05 – 2019-03-07 (×3): 100 mg via ORAL
  Filled 2019-03-04 (×3): qty 1

## 2019-03-04 MED ORDER — LIDOCAINE 2% (20 MG/ML) 5 ML SYRINGE
INTRAMUSCULAR | Status: DC | PRN
  Administered 2019-03-04: 60 mg via INTRAVENOUS

## 2019-03-04 MED ORDER — PROPOFOL 10 MG/ML IV BOLUS
INTRAVENOUS | Status: DC | PRN
  Administered 2019-03-04: 140 mg via INTRAVENOUS

## 2019-03-04 MED ORDER — ONDANSETRON HCL 4 MG/2ML IJ SOLN: 4.0000 mg | Freq: Three times a day (TID) | INTRAMUSCULAR | Status: AC | PRN

## 2019-03-04 MED ORDER — POLYETHYLENE GLYCOL 3350 17 G PO PACK
17.0000 g | PACK | Freq: Every day | ORAL | Status: DC | PRN
  Filled 2019-03-04: qty 1

## 2019-03-04 MED ORDER — METHOCARBAMOL 1000 MG/10ML IJ SOLN
500.0000 mg | Freq: Four times a day (QID) | INTRAVENOUS | Status: DC | PRN
  Filled 2019-03-04: qty 5

## 2019-03-04 MED ORDER — HYDROMORPHONE HCL 1 MG/ML IJ SOLN: 1.0000 mg | INTRAMUSCULAR | Status: DC | PRN

## 2019-03-04 MED ORDER — MIDAZOLAM HCL 2 MG/2ML IJ SOLN
INTRAMUSCULAR | Status: DC | PRN
  Administered 2019-03-04: 2 mg via INTRAVENOUS

## 2019-03-04 MED ORDER — OXYCODONE HCL 5 MG PO TABS
5.0000 mg | ORAL_TABLET | ORAL | Status: DC | PRN
  Administered 2019-03-07: 10 mg via ORAL
  Filled 2019-03-04 (×2): qty 2

## 2019-03-04 MED ORDER — ONDANSETRON HCL 4 MG/2ML IJ SOLN
INTRAMUSCULAR | Status: AC
  Filled 2019-03-04: qty 2

## 2019-03-04 MED ORDER — LORAZEPAM 2 MG/ML IJ SOLN: 1.0000 mg | Freq: Four times a day (QID) | INTRAMUSCULAR | Status: AC | PRN

## 2019-03-04 MED ORDER — ONDANSETRON HCL 4 MG/2ML IJ SOLN
INTRAMUSCULAR | Status: DC | PRN
  Administered 2019-03-04: 4 mg via INTRAVENOUS

## 2019-03-04 MED ORDER — METHOCARBAMOL 500 MG PO TABS
500.0000 mg | ORAL_TABLET | Freq: Four times a day (QID) | ORAL | Status: DC | PRN
  Administered 2019-03-05 – 2019-03-06 (×2): 500 mg via ORAL
  Filled 2019-03-04 (×2): qty 1

## 2019-03-04 MED ORDER — METOCLOPRAMIDE HCL 5 MG/ML IJ SOLN: 5.0000 mg | Freq: Three times a day (TID) | INTRAMUSCULAR | Status: DC | PRN

## 2019-03-04 MED ORDER — SODIUM CHLORIDE 0.9 % IV SOLN
INTRAVENOUS | Status: AC
  Administered 2019-03-04 (×2): via INTRAVENOUS

## 2019-03-04 MED ORDER — FENTANYL CITRATE (PF) 250 MCG/5ML IJ SOLN
INTRAMUSCULAR | Status: AC
  Filled 2019-03-04: qty 5

## 2019-03-04 SURGICAL SUPPLY — 61 items
BANDAGE ACE 4X5 VEL STRL LF (GAUZE/BANDAGES/DRESSINGS) ×3 IMPLANT
BANDAGE ACE 6X5 VEL STRL LF (GAUZE/BANDAGES/DRESSINGS) ×3 IMPLANT
BANDAGE ESMARK 6X9 LF (GAUZE/BANDAGES/DRESSINGS) ×1 IMPLANT
BIT DRILL CALIBRATED 4.3X320MM (BIT) ×1 IMPLANT
BIT DRILL CROWE POINT TWST 4.3 (DRILL) ×2 IMPLANT
BNDG ELASTIC 6X10 VLCR STRL LF (GAUZE/BANDAGES/DRESSINGS) ×3 IMPLANT
BNDG ESMARK 6X9 LF (GAUZE/BANDAGES/DRESSINGS) ×3
COVER MAYO STAND STRL (DRAPES) ×3 IMPLANT
COVER SURGICAL LIGHT HANDLE (MISCELLANEOUS) ×3 IMPLANT
COVER WAND RF STERILE (DRAPES) ×3 IMPLANT
CUFF TOURNIQUET SINGLE 34IN LL (TOURNIQUET CUFF) ×3 IMPLANT
DRAPE C-ARM 42X72 X-RAY (DRAPES) ×3 IMPLANT
DRAPE C-ARMOR (DRAPES) ×3 IMPLANT
DRAPE HALF SHEET 40X57 (DRAPES) ×2 IMPLANT
DRAPE IMP U-DRAPE 54X76 (DRAPES) ×3 IMPLANT
DRAPE POUCH INSTRU U-SHP 10X18 (DRAPES) ×3 IMPLANT
DRAPE U-SHAPE 47X51 STRL (DRAPES) ×3 IMPLANT
DRAPE UTILITY XL STRL (DRAPES) ×6 IMPLANT
DRILL CALIBRATED 4.3X320MM (BIT) ×3
DRILL CROWE POINT TWIST 4.3 (DRILL) ×6
DURAPREP 26ML APPLICATOR (WOUND CARE) ×3 IMPLANT
ELECT CAUTERY BLADE 6.4 (BLADE) ×3 IMPLANT
ELECT REM PT RETURN 9FT ADLT (ELECTROSURGICAL) ×3
ELECTRODE REM PT RTRN 9FT ADLT (ELECTROSURGICAL) ×1 IMPLANT
FACESHIELD WRAPAROUND (MASK) IMPLANT
GAUZE SPONGE 4X4 12PLY STRL (GAUZE/BANDAGES/DRESSINGS) ×3 IMPLANT
GAUZE XEROFORM 1X8 LF (GAUZE/BANDAGES/DRESSINGS) ×6 IMPLANT
GAUZE XEROFORM 5X9 LF (GAUZE/BANDAGES/DRESSINGS) ×3 IMPLANT
GLOVE BIOGEL PI IND STRL 7.0 (GLOVE) ×1 IMPLANT
GLOVE BIOGEL PI INDICATOR 7.0 (GLOVE) ×2
GLOVE ECLIPSE 7.0 STRL STRAW (GLOVE) ×3 IMPLANT
GLOVE SKINSENSE NS SZ7.5 (GLOVE) ×8
GLOVE SKINSENSE STRL SZ7.5 (GLOVE) ×4 IMPLANT
GLOVE SURG SYN 7.5  E (GLOVE)
GLOVE SURG SYN 7.5 E (GLOVE) IMPLANT
GOWN STRL REIN XL XLG (GOWN DISPOSABLE) ×3 IMPLANT
GUIDEPIN 3.2X17.5 THRD DISP (PIN) ×3 IMPLANT
GUIDEWIRE 2.6X80 BEAD TIP (WIRE) ×1 IMPLANT
GUIDWIRE 2.6X80 BEAD TIP (WIRE) ×3
KIT BASIN OR (CUSTOM PROCEDURE TRAY) ×3 IMPLANT
MANIFOLD NEPTUNE II (INSTRUMENTS) ×3 IMPLANT
NAIL TIBIAL PHOENIX 10.5X390 (Nail) ×3 IMPLANT
NS IRRIG 1000ML POUR BTL (IV SOLUTION) ×3 IMPLANT
PACK TOTAL JOINT (CUSTOM PROCEDURE TRAY) ×3 IMPLANT
PACK UNIVERSAL I (CUSTOM PROCEDURE TRAY) ×3 IMPLANT
PAD ABD 8X10 STRL (GAUZE/BANDAGES/DRESSINGS) ×2 IMPLANT
PAD CAST 4YDX4 CTTN HI CHSV (CAST SUPPLIES) ×1 IMPLANT
PADDING CAST COTTON 4X4 STRL (CAST SUPPLIES) ×2
PADDING CAST COTTON 6X4 STRL (CAST SUPPLIES) ×3 IMPLANT
SCREW CORT TI DBL LEAD 5X36 (Screw) ×3 IMPLANT
SCREW CORT TI DBL LEAD 5X38 (Screw) ×3 IMPLANT
SCREW CORT TI DBL LEAD 5X40 (Screw) ×3 IMPLANT
SCREW CORT TI DBL LEAD 5X42 (Screw) ×3 IMPLANT
SCREW CORT TI DBL LEAD 5X65 (Screw) ×3 IMPLANT
STAPLER SKIN PROX WIDE 3.9 (STAPLE) ×3 IMPLANT
SUT VIC AB 0 CT1 27 (SUTURE) ×4
SUT VIC AB 0 CT1 27XBRD ANTBC (SUTURE) ×2 IMPLANT
SUT VIC AB 2-0 CT1 27 (SUTURE) ×4
SUT VIC AB 2-0 CT1 TAPERPNT 27 (SUTURE) ×2 IMPLANT
TOWEL OR 17X26 10 PK STRL BLUE (TOWEL DISPOSABLE) ×6 IMPLANT
WATER STERILE IRR 1000ML POUR (IV SOLUTION) ×3 IMPLANT

## 2019-03-04 NOTE — Progress Notes (Signed)
Initial Nutrition Assessment  DOCUMENTATION CODES:   Not applicable  INTERVENTION:    Ensure Enlive po BID, each supplement provides 350 kcal and 20 grams of protein once diet advanced   Continue MVI daily  NUTRITION DIAGNOSIS:   Increased nutrient needs related to acute illness as evidenced by estimated needs.  GOAL:   Patient will meet greater than or equal to 90% of their needs  MONITOR:   Diet advancement, Supplement acceptance, PO intake, Skin, Labs  REASON FOR ASSESSMENT:   Malnutrition Screening Tool    ASSESSMENT:   Patient without significant past medical history. Presents this admission after a mechanical fall resulting in right closed tibia/fibula fracture.    RD working remotely.  Plan for surgical fixation this afternoon.   Pt sleeping at time of RD call. Spoke with RN, she is unaware if pt had loss of appetite or unintentional wt loss. Per ortho consult note, pt drinks 2-3 40 oz beers daily. Will attempt to speak with pt once out of surgery if possible. RD to provide Ensure BID once diet is advanced. Pt currently NPO.   Chart records lack weight history.   Unable to complete Nutrition-Focused physical exam at this time.   Medications reviewed and include: folic acid, MVI with minerals, thiamine Labs reviewed: Na 134 (L)  Diet Order:   Diet Order            Diet NPO time specified  Diet effective now              EDUCATION NEEDS:   Not appropriate for education at this time  Skin:  Skin Assessment: Reviewed RN Assessment  Last BM:  3/26  Height:   Ht Readings from Last 1 Encounters:  03/03/19 6\' 1"  (1.854 m)    Weight:   Wt Readings from Last 1 Encounters:  03/03/19 70.3 kg    Ideal Body Weight:  83.6 kg  BMI:  Body mass index is 20.45 kg/m.  Estimated Nutritional Needs:   Kcal:  2100-2300 kcal  Protein:  105-120 grams  Fluid:  >/= 2 L/day    Vanessa Kick RD, LDN Clinical Nutrition Pager # - [phone]

## 2019-03-04 NOTE — Anesthesia Preprocedure Evaluation (Signed)
Anesthesia Evaluation  Patient identified by MRN, date of birth, ID band Patient awake    Reviewed: Allergy & Precautions, H&P , NPO status , Patient's Chart, lab work & pertinent test results  Airway Mallampati: II   Neck ROM: full    Dental   Pulmonary Current Smoker,    breath sounds clear to auscultation       Cardiovascular negative cardio ROS   Rhythm:regular Rate:Normal     Neuro/Psych    GI/Hepatic (+)     substance abuse  alcohol use,   Endo/Other    Renal/GU      Musculoskeletal   Abdominal   Peds  Hematology   Anesthesia Other Findings   Reproductive/Obstetrics                             Anesthesia Physical Anesthesia Plan  ASA: II  Anesthesia Plan: General   Post-op Pain Management:    Induction: Intravenous  PONV Risk Score and Plan: 1 and Ondansetron, Dexamethasone, Midazolam and Treatment may vary due to age or medical condition  Airway Management Planned: Oral ETT  Additional Equipment:   Intra-op Plan:   Post-operative Plan: Extubation in OR  Informed Consent: I have reviewed the patients History and Physical, chart, labs and discussed the procedure including the risks, benefits and alternatives for the proposed anesthesia with the patient or authorized representative who has indicated his/her understanding and acceptance.       Plan Discussed with: CRNA, Anesthesiologist and Surgeon  Anesthesia Plan Comments:         Anesthesia Quick Evaluation

## 2019-03-04 NOTE — Plan of Care (Signed)
  Problem: Pain Managment: Goal: General experience of comfort will improve Outcome: Progressing   

## 2019-03-04 NOTE — Consult Note (Signed)
ORTHOPAEDIC HISTORY AND PHYSICAL   Chief Complaint: Right tibia fracture  HPI: Robert Calderon is a 63 y.o. male who presented to the ED last night [place]  He was unable to ambulate immediately afterwards.  He was brought by EMS.  He denies LOC, neck pain, abd pain.  He does drink 2-3 40 oz beers daily.  Currently unemployed.  Lives with sister.  History reviewed. No pertinent past medical history. History reviewed. No pertinent surgical history. Social History   Socioeconomic History   Marital status: Divorced    Spouse name: Not on file   Number of children: Not on file   Years of education: Not on file   Highest education level: Not on file  Occupational History   Not on file  Social Needs   Financial resource strain: Not on file   Food insecurity:    Worry: Not on file    Inability: Not on file   Transportation needs:    Medical: Not on file    Non-medical: Not on file  Tobacco Use   Smoking status: Current Every Day Smoker   Smokeless tobacco: Never Used  Substance and Sexual Activity   Alcohol use: Yes   Drug use: No   Sexual activity: Not on file  Lifestyle   Physical activity:    Days per week: Not on file    Minutes per session: Not on file   Stress: Not on file  Relationships   Social connections:    Talks on phone: Not on file    Gets together: Not on file    Attends religious service: Not on file    Active member of club or organization: Not on file    Attends meetings of clubs or organizations: Not on file    Relationship status: Not on file  Other Topics Concern   Not on file  Social History Narrative   Not on file   History reviewed. No pertinent family history. No Known Allergies Prior to Admission medications   Not on File   Dg Tibia/fibula Right  Result Date: 03/03/2019 CLINICAL DATA:  Fall down concrete stairs with right lower extremity pain and deformity. EXAM:  RIGHT TIBIA AND FIBULA - 2 VIEW COMPARISON:  None. FINDINGS: Spiral distal tibial shaft fracture has 1 corner shaft with lateral displacement of distal fracture fragment. Comminuted displaced proximal fibular shaft fracture has 1/2 shaft with lateral displacement of dominant fracture fragment. Neither fracture extends to the articular surface of the knee or ankle. Knee and ankle alignment is maintained. IMPRESSION: 1. Spiral distal tibial shaft fracture with lateral displacement of distal fracture fragment. 2. Comminuted displaced proximal fibular shaft fracture. Electronically Signed   By: Narda Rutherford M.D.   On: 03/03/2019 23:16   Ct Cervical Spine Wo Contrast  Result Date: 03/04/2019 CLINICAL DATA:  Initial evaluation for acute trauma, fall. EXAM: CT CERVICAL SPINE WITHOUT CONTRAST TECHNIQUE: Multidetector CT imaging of the cervical spine was performed without intravenous contrast. Multiplanar CT image reconstructions were also generated. COMPARISON:  Prior CT from 04/12/2016. FINDINGS: Alignment: Straightening with slight reversal of the normal cervical lordosis. Trace anterolisthesis of C3 on C4, likely chronic and facet mediated. No malalignment. Skull base and vertebrae: Skull base intact. Normal C1-2 articulations are preserved in the dens is intact. Vertebral body heights maintained. No acute fracture. Soft tissues and spinal canal: Soft tissues of the neck demonstrate no acute finding. No abnormal prevertebral edema. Vascular  calcifications noted about the carotid bifurcations. Spinal canal demonstrates no acute finding. Disc levels: Moderate multilevel cervical spondylolysis, most notable at C5-6 and C6-7. Advanced multilevel facet arthrosis with ankylosis seen on the left at C2-3 through C4-5, and on the right at C2-3 and C3-4. Prominent degenerative changes about the C1-2 articulation and skull base. Upper chest: Visualized upper chest demonstrates no acute finding. Visualized lung apices are  clear. Centrilobular and paraseptal emphysematous changes noted. Other: None. IMPRESSION: 1. No acute traumatic injury within the cervical spine. 2. Moderate multilevel cervical spondylolysis, most notable at C5-6 and C6-7. 3. 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 4. Emphysema. Electronically Signed   By: Rise Mu M.D.   On: 03/04/2019 00:32   - pertinent xrays, CT, MRI studies were reviewed and independently interpreted  Positive ROS: All other systems have been reviewed and were otherwise negative with the exception of those mentioned in the HPI and as above.  Physical Exam: General: Alert, no acute distress Cardiovascular: No pedal edema Respiratory: No cyanosis, no use of accessory musculature GI: No organomegaly, abdomen is soft and non-tender Skin: No lesions in the area of chief complaint Neurologic: Sensation intact distally Psychiatric: Patient is competent for consent with normal mood and affect Lymphatic: No axillary or cervical lymphadenopathy  MUSCULOSKELETAL:  - well fitting splint - toes wwp - compartments soft - no pain with passive stretch of toes  Assessment: Right tibia-fibula fracture  Plan: - will plan for surgical fixation this afternoon - continue NPO, hold DVT chemoprophylaxis - informed consent obtained - CIWA protocol   N. Glee Arvin, MD [facility] [phone] 8:51 AM

## 2019-03-04 NOTE — Anesthesia Procedure Notes (Signed)
Procedure Name: Intubation Date/Time: 03/04/2019 3:57 PM Performed by: Mayer Camel, CRNA Pre-anesthesia Checklist: Patient identified, Emergency Drugs available, Suction available and Patient being monitored Patient Re-evaluated:Patient Re-evaluated prior to induction Oxygen Delivery Method: Circle System Utilized Preoxygenation: Pre-oxygenation with 100% oxygen Induction Type: IV induction Ventilation: Mask ventilation without difficulty Laryngoscope Size: Miller and 2 Grade View: Grade I Tube type: Oral Tube size: 7.5 mm Number of attempts: 1 Airway Equipment and Method: Stylet and Oral airway Placement Confirmation: ETT inserted through vocal cords under direct vision,  positive ETCO2 and breath sounds checked- equal and bilateral Secured at: 22 cm Tube secured with: Tape Dental Injury: Teeth and Oropharynx as per pre-operative assessment

## 2019-03-04 NOTE — ED Notes (Signed)
ED TO INPATIENT HANDOFF REPORT  ED Nurse Name and Phone #: Carmie Kanner, RN [phone]  S Name/Age/Gender Rowe Robert 63 y.o. male Room/Bed: 030C/030C  Code Status   Code Status: Not on file  Home/SNF/Other Home Patient oriented to: self, place, time and situation Is this baseline? Yes   Triage Complete: Triage complete  Chief Complaint fall, lower leg  Triage Note Pt presents with deformity to R shin after falling down concrete stairs PTA.  +ETOH, -LOC   Allergies No Known Allergies  Level of Care/Admitting Diagnosis ED Disposition    ED Disposition Condition Comment   Admit  Hospital Area: [facility] [[id number]]  Level of Care: Telemetry Medical [104]  Diagnosis: Tibia/fibula fracture [[id number]]  Admitting Physician: Cammy Copa [[id number]]  Attending Physician: Cammy Copa [[id number]]  Estimated length of stay: past midnight tomorrow  Certification:: I certify this patient will need inpatient services for at least 2 midnights  PT Class (Do Not Modify): Inpatient [101]  PT Acc Code (Do Not Modify): Private [1]       B Medical/Surgery History History reviewed. No pertinent past medical history. History reviewed. No pertinent surgical history.   A IV Location/Drains/Wounds Patient Lines/Drains/Airways Status   Active Line/Drains/Airways    Name:   Placement date:   Placement time:   Site:   Days:   Peripheral IV 03/03/19 Right;Posterior Forearm   03/03/19    2330    Forearm   1          Intake/Output Last 24 hours No intake or output data in the 24 hours ending 03/04/19 0213  Labs/Imaging Results for orders placed or performed during the hospital encounter of 03/03/19 (from the past 48 hour(s))  CBC     Status: None   Collection Time: 03/03/19 11:28 PM  Result Value Ref Range   WBC 4.6 4.0 - 10.5 K/uL   RBC 5.23 4.22 - 5.81 MIL/uL   Hemoglobin 15.0 13.0 - 17.0 g/dL   HCT 11.9 14.7 - 82.9 %   MCV 85.9 80.0 - 100.0 fL   MCH 28.7  26.0 - 34.0 pg   MCHC 33.4 30.0 - 36.0 g/dL   RDW 56.2 13.0 - 86.5 %   Platelets 223 150 - 400 K/uL   nRBC 0.0 0.0 - 0.2 %    Comment: Performed at [facility], [address] [place], [place] [address]  Ethanol     Status: Abnormal   Collection Time: 03/03/19 11:28 PM  Result Value Ref Range   Alcohol, Ethyl (B) 350 (HH) <10 mg/dL    Comment: CRITICAL RESULT CALLED TO, READ BACK BY AND VERIFIED WITH: Aurea Graff C,RN 03/04/19 0055 WAYK Performed at [facility], [address] [place], [place] [address]   Rapid urine drug screen (hospital performed)     Status: Abnormal   Collection Time: 03/04/19 12:25 AM  Result Value Ref Range   Opiates NONE DETECTED NONE DETECTED   Cocaine POSITIVE (A) NONE DETECTED   Benzodiazepines NONE DETECTED NONE DETECTED   Amphetamines NONE DETECTED NONE DETECTED   Tetrahydrocannabinol NONE DETECTED NONE DETECTED   Barbiturates NONE DETECTED NONE DETECTED    Comment: (NOTE) DRUG SCREEN FOR MEDICAL PURPOSES ONLY.  IF CONFIRMATION IS NEEDED FOR ANY PURPOSE, NOTIFY LAB WITHIN 5 DAYS. LOWEST DETECTABLE LIMITS FOR URINE DRUG SCREEN Drug Class                     Cutoff (ng/mL) Amphetamine and  metabolites    1000 Barbiturate and metabolites    200 Benzodiazepine                 200 Tricyclics and metabolites     300 Opiates and metabolites        300 Cocaine and metabolites        300 THC                            50 Performed at [facility], [address] [place], [place] [address]   Basic metabolic panel     Status: Abnormal   Collection Time: 03/04/19 12:58 AM  Result Value Ref Range   Sodium 134 (L) 135 - 145 mmol/L   Potassium 4.4 3.5 - 5.1 mmol/L   Chloride 100 98 - 111 mmol/L   CO2 24 22 - 32 mmol/L   Glucose, Bld 141 (H) 70 - 99 mg/dL   BUN 5 (L) 8 - 23 mg/dL   Creatinine, Ser 3.53 0.61 - 1.24 mg/dL   Calcium 8.6 (L) 8.9 - 10.3 mg/dL   GFR calc non Af Amer >60 >60 mL/min   GFR calc Af Amer >60 >60 mL/min    Anion gap 10 5 - 15    Comment: Performed at [facility], [address], [place], [place] [address]   Dg Tibia/fibula Right  Result Date: 03/03/2019 CLINICAL DATA:  Fall down concrete stairs with right lower extremity pain and deformity. EXAM: RIGHT TIBIA AND FIBULA - 2 VIEW COMPARISON:  None. FINDINGS: Spiral distal tibial shaft fracture has 1 corner shaft with lateral displacement of distal fracture fragment. Comminuted displaced proximal fibular shaft fracture has 1/2 shaft with lateral displacement of dominant fracture fragment. Neither fracture extends to the articular surface of the knee or ankle. Knee and ankle alignment is maintained. IMPRESSION: 1. Spiral distal tibial shaft fracture with lateral displacement of distal fracture fragment. 2. Comminuted displaced proximal fibular shaft fracture. Electronically Signed   By: Narda Rutherford M.D.   On: 03/03/2019 23:16   Ct Cervical Spine Wo Contrast  Result Date: 03/04/2019 CLINICAL DATA:  Initial evaluation for acute trauma, fall. EXAM: CT CERVICAL SPINE WITHOUT CONTRAST TECHNIQUE: Multidetector CT imaging of the cervical spine was performed without intravenous contrast. Multiplanar CT image reconstructions were also generated. COMPARISON:  Prior CT from 04/12/2016. FINDINGS: Alignment: Straightening with slight reversal of the normal cervical lordosis. Trace anterolisthesis of C3 on C4, likely chronic and facet mediated. No malalignment. Skull base and vertebrae: Skull base intact. Normal C1-2 articulations are preserved in the dens is intact. Vertebral body heights maintained. No acute fracture. Soft tissues and spinal canal: Soft tissues of the neck demonstrate no acute finding. No abnormal prevertebral edema. Vascular calcifications noted about the carotid bifurcations. Spinal canal demonstrates no acute finding. Disc levels: Moderate multilevel cervical spondylolysis, most notable at C5-6 and C6-7. Advanced multilevel facet arthrosis  with ankylosis seen on the left at C2-3 through C4-5, and on the right at C2-3 and C3-4. Prominent degenerative changes about the C1-2 articulation and skull base. Upper chest: Visualized upper chest demonstrates no acute finding. Visualized lung apices are clear. Centrilobular and paraseptal emphysematous changes noted. Other: None. IMPRESSION: 1. No acute traumatic injury within the cervical spine. 2. Moderate multilevel cervical spondylolysis, most notable at C5-6 and C6-7. 3. 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 4. Emphysema. Electronically Signed  By: Rise Mu M.D.   On: 03/04/2019 00:32    Pending Labs Unresulted Labs (From admission, onward)   None      Vitals/Pain Today's Vitals   03/03/19 2230 03/03/19 2232 03/04/19 0021 03/04/19 0021  BP:  (!) 166/70 140/89   Pulse:  72 64   Resp:  18 15   Temp:  (!) 97.5 F (36.4 C)    TempSrc:  Oral    SpO2:  96% 94%   Weight: 70.3 kg     Height: 6\' 1"  (1.854 m)     PainSc: 10-Worst pain ever   10-Worst pain ever    Isolation Precautions No active isolations  Medications Medications  0.9 %  sodium chloride infusion (has no administration in time range)  HYDROmorphone (DILAUDID) injection 1 mg (has no administration in time range)  ondansetron (ZOFRAN) injection 4 mg (has no administration in time range)  fentaNYL (SUBLIMAZE) injection 25 mcg (25 mcg Intravenous Given 03/03/19 2330)  HYDROmorphone (DILAUDID) injection 1 mg (1 mg Intravenous Given 03/04/19 0022)    Mobility non-ambulatory Moderate fall risk   Focused Assessments musculosketal   R Recommendations: See Admitting Provider Note  Report given to:   Additional Notes:

## 2019-03-04 NOTE — H&P (Signed)
See consult note

## 2019-03-04 NOTE — Op Note (Signed)
Date of Surgery: 03/04/2019  INDICATIONS: Robert Calderon is a 63 y.o.-year-old male who sustained a right tibia and fibula fracture. The risks and benefits of the procedure discussed with the patient prior to the procedure and all questions were answered; consent was obtained.  PREOPERATIVE DIAGNOSIS:  right tibia- and fibula fracture  POSTOPERATIVE DIAGNOSIS: Same  PROCEDURE:   1. right tibia closed reduction and intramedullary nailing CPT: 27759  2. closed treatment of left fibular shaft fracture with manipulation, CPT - 67124  SURGEON: N. Glee Arvin, M.D.  ASSIST: Starlyn Skeans [place], [place]; necessary for the timely completion of procedure and due to complexity of procedure.  ANESTHESIA:  general  IV FLUIDS AND URINE: See anesthesia record.  ESTIMATED BLOOD LOSS: minimal mL.  IMPLANTS: [employer] [place] 10.5 x 39   DRAINS: None.  COMPLICATIONS: None.  DESCRIPTION OF PROCEDURE: The patient was brought to the operating room and placed supine on the operating table.  The patient's leg had been signed prior to the procedure.  The patient had the anesthesia placed by the anesthesiologist.  The prep verification and incision time-outs were performed to confirm that this was the correct patient, site, side and location. The patient had an SCD on the opposite lower extremity. The patient did receive antibiotics prior to the incision and was re-dosed during the procedure as needed at indicated intervals.  The patient had the lower extremity prepped and draped in the standard surgical fashion.  The incision was first made over the quadriceps tendon in the midline and taken down to the skin and subcutaneous tissue to expose the peritenon. The peritenon was incised in line with the skin incision and then a poke hole was made in the quadriceps tendon in the midline. A knife was then used to longitudinally divide the tendon in line with its fibers, taking care not to cross over any fibers. The guide  wire was placed at the proximal, anterior tibia, confirming its location on both AP and lateral views. The wire was drilled into the bone and then the opening reamer was placed over this and maneuvered so that the reamer was parallel with anterior cortex of the tibia. The ball-tipped guide wire was then placed down into the canal towards the fracture site. The fracture was reduced and clamped and the wire was passed and confirmed to be in the proper location on both AP and lateral views.  The measuring stick was used to measure the length of the nail.  Sequential reaming was then performed, then the nail was gently hammered into place over the guide wire and the guide wire was removed. The proximal screws were placed through the interlocking drill guide using the sleeve. The distal screws were placed using the perfect circles technique. All screws were placed in the standard fashion, first incising the skin and then spreading with a tonsil, then drilling, measuring with a depth gauge, and then placing the screws by hand. The final x-rays were taken in both AP and lateral views to confirm the fracture reduction as well as the placement of all hardware. The fibula fracture was treated in a closed manner by closed manipulation.  The wounds were copiously irrigated with saline and then the peritenon was closed with 0 Vicryl figure-of-eight interrupted sutures. 2.0 vicryl was used to close the subcutaneous layer.  Staples were then used to close all of the open incision wounds.  The wounds were cleaned and dried a final time and a sterile dressing was placed. The patient  was then placed in a short leg splint in neutral ankle dorsiflexion. The patient's calf was soft to palpation at the end of the case.  The patient was then transferred to a bed and taken to the recovery room in stable condition.  All counts were correct at the end of the case.  POSTOPERATIVE PLAN: Mr. Kellison will be NWB and will return in 2 weeks for  suture removal.  Mr. Wescott will receive DVT prophylaxis.  Mayra Reel, MD [facility] [phone] 6:22 PM

## 2019-03-04 NOTE — Progress Notes (Addendum)
1520 Pt to short stay, A&O x4. NPO post midnight maint. RLE with splint dry and intact. Report was given to [facility] in short stay. 1845 Received pt from PACU, A&O x4. RLE with ace wrap dry and intact, toes are warm and mobile. C/o RLE pain, medicated.

## 2019-03-04 NOTE — Discharge Instructions (Signed)
° ° °  1. Keep splint clean and dry °2. Elevate foot above level of the heart °3. Take aspirin to prevent blood clots °4. Take pain meds as needed °5. Strict non weight bearing to operative extremity ° °

## 2019-03-04 NOTE — Transfer of Care (Signed)
Immediate Anesthesia Transfer of Care Note  Patient: Robert Calderon  Procedure(s) Performed: INTRAMEDULLARY (IM) NAIL TIBIAL (Right Leg Lower)  Patient Location: PACU  Anesthesia Type:General  Level of Consciousness: awake, drowsy and patient cooperative  Airway & Oxygen Therapy: Patient Spontanous Breathing and Patient connected to face mask oxygen  Post-op Assessment: Report given to RN and Post -op Vital signs reviewed and stable  Post vital signs: Reviewed and stable  Last Vitals:  Vitals Value Taken Time  BP    Temp    Pulse 93 03/04/2019  5:52 PM  Resp 15 03/04/2019  5:52 PM  SpO2 100 % 03/04/2019  5:52 PM  Vitals shown include unvalidated device data.  Last Pain:  Vitals:   03/04/19 1000  TempSrc:   PainSc: 3       Patients Stated Pain Goal: 0 (03/04/19 0236)  Complications: No apparent anesthesia complications

## 2019-03-05 NOTE — Evaluation (Signed)
Physical Therapy Evaluation Patient Details Name: Robert Calderon MRN: [id number] DOB: [birth year] Today's Date: 03/05/2019   History of Present Illness  Pt is a 63 y/o male admitted following fall down steps. + ETOH. Imaging revealed a R tib/fib fx and pt is s/p R IM nail placement. No pertinent PMH on file.   Clinical Impression  Pt is s/p surgery above with deficits below. Pt required min A +2 to transfer to chair this session using RW. Further mobility deferred secondary to pain. Feel pt will require increased assist at d/c and feel he is at increased risk for falls. Will continue to follow acutely to maximize functional mobility independence and safety.     Follow Up Recommendations SNF;Supervision for mobility/OOB    Equipment Recommendations  Rolling walker with 5" wheels;3in1 (PT)    Recommendations for Other Services       Precautions / Restrictions Precautions Precautions: Fall Restrictions Weight Bearing Restrictions: Yes RLE Weight Bearing: Non weight bearing      Mobility  Bed Mobility Overal bed mobility: Needs Assistance Bed Mobility: Supine to Sit     Supine to sit: Min assist     General bed mobility comments: Min A for RLE assist. Increased time to come to EOB.   Transfers Overall transfer level: Needs assistance Equipment used: Rolling walker (2 wheeled) Transfers: Sit to/from [employer] Sit to Stand: Min assist;+2 physical assistance Stand pivot transfers: Min assist;+2 physical assistance       General transfer comment: Min A for lift assist and steadying to stand and min A +2 for steadying throughout transfer to chair. Required cues for sequencing. Able to maintain NWB. Further mobility limited by pain.   Ambulation/Gait                Stairs            Wheelchair Mobility    Modified Rankin (Stroke Patients Only)       Balance Overall balance assessment: Needs assistance Sitting-balance support: No upper  extremity supported;Feet supported Sitting balance-Leahy Scale: Fair     Standing balance support: Bilateral upper extremity supported;During functional activity Standing balance-Leahy Scale: Poor Standing balance comment: Reliant on BUE support                              Pertinent Vitals/Pain Pain Assessment: 0-10 Pain Score: 8  Pain Location: RLE  Pain Descriptors / Indicators: Aching;Operative site guarding Pain Intervention(s): Limited activity within patient's tolerance;Monitored during session;Repositioned    Home Living Family/patient expects to be discharged to:: Private residence Living Arrangements: Other relatives(sister) Available Help at Discharge: Family;Available PRN/intermittently Type of Home: Apartment Home Access: Elevator     Home Layout: One level Home Equipment: None      Prior Function Level of Independence: Independent               Hand Dominance        Extremity/Trunk Assessment   Upper Extremity Assessment Upper Extremity Assessment: Overall WFL for tasks assessed    Lower Extremity Assessment Lower Extremity Assessment: RLE deficits/detail RLE Deficits / Details: Deficits consistent with post op pain and weaknes.     Cervical / Trunk Assessment Cervical / Trunk Assessment: Normal  Communication   Communication: No difficulties  Cognition Arousal/Alertness: Awake/alert Behavior During Therapy: WFL for tasks assessed/performed Overall Cognitive Status: Within Functional Limits for tasks assessed  General Comments      Exercises     Assessment/Plan    PT Assessment Patient needs continued PT services  PT Problem List Decreased strength;Decreased range of motion;Decreased balance;Decreased activity tolerance;Decreased mobility;Decreased knowledge of use of DME;Decreased knowledge of precautions;Pain       PT Treatment Interventions DME instruction;Gait  training;Functional mobility training;Therapeutic activities;Therapeutic exercise;Balance training;Patient/family education    PT Goals (Current goals can be found in the Care Plan section)  Acute Rehab PT Goals Patient Stated Goal: to decrease pain  PT Goal Formulation: With patient Time For Goal Achievement: 03/19/19 Potential to Achieve Goals: Good    Frequency Min 3X/week   Barriers to discharge        Co-evaluation               AM-PAC PT "6 Clicks" Mobility  Outcome Measure Help needed turning from your back to your side while in a flat bed without using bedrails?: A Little Help needed moving from lying on your back to sitting on the side of a flat bed without using bedrails?: A Little Help needed moving to and from a bed to a chair (including a wheelchair)?: A Little Help needed standing up from a chair using your arms (e.g., wheelchair or bedside chair)?: A Little Help needed to walk in hospital room?: A Lot Help needed climbing 3-5 steps with a railing? : Total 6 Click Score: 15    End of Session Equipment Utilized During Treatment: Gait belt Activity Tolerance: Patient limited by pain Patient left: in chair;with call bell/phone within reach;with chair alarm set Nurse Communication: Mobility status PT Visit Diagnosis: Other abnormalities of gait and mobility (R26.89);Pain;Difficulty in walking, not elsewhere classified (R26.2) Pain - Right/Left: Right Pain - part of body: Leg    Time: 9485-4627 PT Time Calculation (min) (ACUTE ONLY): 13 min   Charges:   PT Evaluation $PT Eval Moderate Complexity: 1 Mod          Gladys Damme, PT, DPT  Acute Rehabilitation Services  Pager: [phone] Office: [phone]   Le[facility] Prom 03/05/2019, 1:00 PM

## 2019-03-05 NOTE — Plan of Care (Signed)

## 2019-03-05 NOTE — Progress Notes (Signed)
Subjective: 1 Day Post-Op Procedure(s) (LRB): INTRAMEDULLARY (IM) NAIL TIBIAL (Right) Patient reports pain as moderate.   Objective: Vital signs in last 24 hours: Temp:  [97.3 F (36.3 C)-99 F (37.2 C)] 98.6 F (37 C) (03/28 0402) Pulse Rate:  [74-98] 77 (03/28 0402) Resp:  [13-20] 16 (03/28 0402) BP: (144-174)/(80-110) 144/110 (03/28 0402) SpO2:  [87 %-100 %] 94 % (03/28 0402) Weight:  [70.3 kg] 70.3 kg (03/27 1530)  Intake/Output from previous day: 03/27 0701 - 03/28 0700 In: 1768.4 [P.O.:240; I.V.:1418.4; IV Piggyback:110] Out: 1965 [Urine:1925; Blood:40] Intake/Output this shift: No intake/output data recorded.  Recent Labs    03/03/19 2328  HGB 15.0   Recent Labs    03/03/19 2328  WBC 4.6  RBC 5.23  HCT 44.9  PLT 223   Recent Labs    03/04/19 0058  NA 134*  K 4.4  CL 100  CO2 24  BUN 5*  CREATININE 0.94  GLUCOSE 141*  CALCIUM 8.6*   No results for input(s): LABPT, INR in the last 72 hours.  Neurologically intact Neurovascular intact Sensation intact distally Intact pulses distally Dorsiflexion/Plantar flexion intact Incision: dressing C/D/I No cellulitis present Compartment soft   Assessment/Plan: 1 Day Post-Op Procedure(s) (LRB): INTRAMEDULLARY (IM) NAIL TIBIAL (Right) Advance diet Up with PT NWB RLE Continue to ice and elevate for pain and swelling Will likely need SNF at d/c      [facility] 03/05/2019, 7:59 AM

## 2019-03-06 LAB — CBC
HCT: 34.8 % — ABNORMAL LOW (ref 39.0–52.0)
Hemoglobin: 11.5 g/dL — ABNORMAL LOW (ref 13.0–17.0)
MCH: 28 pg (ref 26.0–34.0)
MCHC: 33 g/dL (ref 30.0–36.0)
MCV: 84.7 fL (ref 80.0–100.0)
Platelets: 220 10*3/uL (ref 150–400)
RBC: 4.11 MIL/uL — AB (ref 4.22–5.81)
RDW: 12.6 % (ref 11.5–15.5)
WBC: 4.5 10*3/uL (ref 4.0–10.5)
nRBC: 0 % (ref 0.0–0.2)

## 2019-03-06 LAB — BASIC METABOLIC PANEL
Anion gap: 8 (ref 5–15)
BUN: 13 mg/dL (ref 8–23)
CO2: 26 mmol/L (ref 22–32)
Calcium: 8.2 mg/dL — ABNORMAL LOW (ref 8.9–10.3)
Chloride: 96 mmol/L — ABNORMAL LOW (ref 98–111)
Creatinine, Ser: 1.09 mg/dL (ref 0.61–1.24)
GFR calc non Af Amer: >60 mL/min (ref >60)
Glucose, Bld: 152 mg/dL — ABNORMAL HIGH (ref 70–99)
Potassium: 3.5 mmol/L (ref 3.5–5.1)
Sodium: 130 mmol/L — ABNORMAL LOW (ref 135–145)

## 2019-03-06 MED ORDER — ONDANSETRON HCL 4 MG PO TABS: 4.0000 mg | ORAL_TABLET | Freq: Three times a day (TID) | ORAL | 0 refills | Status: AC | PRN

## 2019-03-06 MED ORDER — ZINC SULFATE 220 (50 ZN) MG PO CAPS: 220.0000 mg | ORAL_CAPSULE | Freq: Every day | ORAL | 0 refills | Status: AC

## 2019-03-06 MED ORDER — CALCIUM CARBONATE-VITAMIN D 500-200 MG-UNIT PO TABS: 1.0000 | ORAL_TABLET | Freq: Three times a day (TID) | ORAL | 6 refills | Status: AC

## 2019-03-06 MED ORDER — METHOCARBAMOL 750 MG PO TABS: 750.0000 mg | ORAL_TABLET | Freq: Two times a day (BID) | ORAL | 0 refills | Status: AC | PRN

## 2019-03-06 MED ORDER — OXYCODONE-ACETAMINOPHEN 5-325 MG PO TABS: 1.0000 | ORAL_TABLET | Freq: Three times a day (TID) | ORAL | 0 refills | Status: AC | PRN

## 2019-03-06 NOTE — Progress Notes (Signed)
Doing well this morning.  No events last night. Surgical dressings are c/d/i.  Compartments soft.  NVI. PT recommending SNF but he is uninsured and was positive for cocaine and ETOH on admission. He may have an option to stay with sister upon hospital discharge. CBC and BMP ordered.  Mayra Reel, MD [facility] [phone] 5:04 PM

## 2019-03-06 NOTE — Anesthesia Postprocedure Evaluation (Signed)
Anesthesia Post Note  Patient: Robert Calderon  Procedure(s) Performed: INTRAMEDULLARY (IM) NAIL TIBIAL (Right Leg Lower)     Patient location during evaluation: PACU Anesthesia Type: General Level of consciousness: awake and alert Pain management: pain level controlled Vital Signs Assessment: post-procedure vital signs reviewed and stable Respiratory status: spontaneous breathing, nonlabored ventilation, respiratory function stable and patient connected to nasal cannula oxygen Cardiovascular status: blood pressure returned to baseline and stable Postop Assessment: no apparent nausea or vomiting Anesthetic complications: no    Last Vitals:  Vitals:   03/06/19 0451 03/06/19 0556  BP: 115/68 115/68  Pulse: 82 82  Resp: 15   Temp: 36.7 C   SpO2: 96%     Last Pain:  Vitals:   03/06/19 0451  TempSrc: Oral  PainSc:                  Kennieth Rad

## 2019-03-06 NOTE — Progress Notes (Addendum)
CSW acknowledges SNF consult. Patient has no documented insurance. CSW will consult with patient about options if patient is able to private pay, if not CSW will inquire regarding home support.   Barriers to SNF placement: No insurance. Patient also positive for cocaine and alcohol at admission.   Update: Patient reports he can possibly have support at home with his sister. He is in the process of reaching out to her to discuss a plan for when he is discharged from the hospital. CSW offered to call sister for patient however patient reported he will contact her and should have a plan by tomorrow.    [place], [place] [phone]

## 2019-03-06 NOTE — Plan of Care (Signed)

## 2019-03-07 ENCOUNTER — Encounter (HOSPITAL_COMMUNITY): Payer: Self-pay | Admitting: Orthopaedic Surgery

## 2019-03-07 NOTE — Progress Notes (Signed)
Pt given oral and written discharge instructions. Removed peripheral IV without complication. Offered assistance with dressing and pt refused stating he can do it himself. Informed of cab voucher. Called [employer] and requested transport. Removed telemetry and returned to charge nurse.

## 2019-03-07 NOTE — Discharge Summary (Signed)
Patient ID: Robert Calderon MRN: [id number] DOB/AGE: 1956/05/29 63 y.o.  Admit date: 03/03/2019 Discharge date: 03/07/2019  Admission Diagnoses:  Principal Problem:   Tibia/fibula fracture, right, closed, initial encounter   Discharge Diagnoses:  Same  History reviewed. No pertinent past medical history.  Surgeries: Procedure(s): INTRAMEDULLARY (IM) NAIL TIBIAL on 03/04/2019   Consultants:   Discharged Condition: Improved  Hospital Course: Robert Calderon is an 63 y.o. male who was admitted 03/03/2019 for operative treatment ofTibia/fibula fracture, right, closed, initial encounter. Patient has severe unremitting pain that affects sleep, daily activities, and work/hobbies. After pre-op clearance the patient was taken to the operating room on 03/04/2019 and underwent  Procedure(s): INTRAMEDULLARY (IM) NAIL TIBIAL.    Patient was given perioperative antibiotics:  Anti-infectives (From admission, onward)   Start     Dose/Rate Route Frequency Ordered Stop   03/04/19 2200  ceFAZolin (ANCEF) IVPB 2g/100 mL premix     2 g 200 mL/hr over 30 Minutes Intravenous Every 6 hours 03/04/19 1852 03/05/19 1042   03/04/19 1000  ceFAZolin (ANCEF) IVPB 2g/100 mL premix    Note to Pharmacy:  Anesthesia to give preop   2 g 200 mL/hr over 30 Minutes Intravenous To Short Stay 03/04/19 0951 03/04/19 1600       Patient was given sequential compression devices, early ambulation, and chemoprophylaxis to prevent DVT.  Patient benefited maximally from hospital stay and there were no complications.    Recent vital signs:  Patient Vitals for the past 24 hrs:  BP Temp Temp src Pulse Resp SpO2  03/07/19 0507 (!) 84/49 98.6 F (37 C) Oral 75 16 97 %  03/07/19 0300 - - - - - 97 %  03/06/19 1948 109/69 98.8 F (37.1 C) Oral 86 15 100 %     Recent laboratory studies:  Recent Labs    03/06/19 1847  WBC 4.5  HGB 11.5*  HCT 34.8*  PLT 220  NA 130*  K 3.5  CL 96*  CO2 26  BUN 13  CREATININE 1.09   GLUCOSE 152*  CALCIUM 8.2*     Discharge Medications:   Allergies as of 03/07/2019   No Known Allergies     Medication List    TAKE these medications   calcium-vitamin D 500-200 MG-UNIT tablet Commonly known as:  OSCAL WITH D Take 1 tablet by mouth 3 (three) times daily.   methocarbamol 750 MG tablet Commonly known as:  ROBAXIN Take 1 tablet (750 mg total) by mouth 2 (two) times daily as needed for muscle spasms.   ondansetron 4 MG tablet Commonly known as:  ZOFRAN Take 1-2 tablets (4-8 mg total) by mouth every 8 (eight) hours as needed for nausea or vomiting.   oxyCODONE-acetaminophen 5-325 MG tablet Commonly known as:  Percocet Take 1-2 tablets by mouth every 8 (eight) hours as needed for severe pain.   zinc sulfate 220 (50 Zn) MG capsule Take 1 capsule (220 mg total) by mouth daily.            Durable Medical Equipment  (From admission, onward)         Start     Ordered   03/04/19 1853  DME Walker rolling  Once    Question:  Patient needs a walker to treat with the following condition  Answer:  History of open reduction and internal fixation (ORIF) procedure   03/04/19 1852   03/04/19 1853  DME 3 n 1  Once     03/04/19 8453  03/04/19 1853  DME Bedside commode  Once    Question:  Patient needs a bedside commode to treat with the following condition  Answer:  History of open reduction and internal fixation (ORIF) procedure   03/04/19 1852          Diagnostic Studies: Dg Tibia/fibula Right  Result Date: 03/04/2019 CLINICAL DATA:  Intramedullary nail placement. Postop. EXAM: RIGHT TIBIA AND FIBULA - 2 VIEW; DG C-ARM 61-120 MIN COMPARISON:  None. FINDINGS: Intramedullary nail has been placed across a spiral distal tibial shaft fracture, with lateral displacement. Satisfactory and improved position and alignment. Fibular shaft fracture was better seen on the preoperative radiographs. IMPRESSION: As above. Electronically Signed   By: Robert Calderon M.D.   On:  03/04/2019 20:09   Dg Tibia/fibula Right  Result Date: 03/03/2019 CLINICAL DATA:  Fall down concrete stairs with right lower extremity pain and deformity. EXAM: RIGHT TIBIA AND FIBULA - 2 VIEW COMPARISON:  None. FINDINGS: Spiral distal tibial shaft fracture has 1 corner shaft with lateral displacement of distal fracture fragment. Comminuted displaced proximal fibular shaft fracture has 1/2 shaft with lateral displacement of dominant fracture fragment. Neither fracture extends to the articular surface of the knee or ankle. Knee and ankle alignment is maintained. IMPRESSION: 1. Spiral distal tibial shaft fracture with lateral displacement of distal fracture fragment. 2. Comminuted displaced proximal fibular shaft fracture. Electronically Signed   By: Robert Calderon M.D.   On: 03/03/2019 23:16   Ct Cervical Spine Wo Contrast  Result Date: 03/04/2019 CLINICAL DATA:  Initial evaluation for acute trauma, fall. EXAM: CT CERVICAL SPINE WITHOUT CONTRAST TECHNIQUE: Multidetector CT imaging of the cervical spine was performed without intravenous contrast. Multiplanar CT image reconstructions were also generated. COMPARISON:  Prior CT from 04/12/2016. FINDINGS: Alignment: Straightening with slight reversal of the normal cervical lordosis. Trace anterolisthesis of C3 on C4, likely chronic and facet mediated. No malalignment. Skull base and vertebrae: Skull base intact. Normal C1-2 articulations are preserved in the dens is intact. Vertebral body heights maintained. No acute fracture. Soft tissues and spinal canal: Soft tissues of the neck demonstrate no acute finding. No abnormal prevertebral edema. Vascular calcifications noted about the carotid bifurcations. Spinal canal demonstrates no acute finding. Disc levels: Moderate multilevel cervical spondylolysis, most notable at C5-6 and C6-7. Advanced multilevel facet arthrosis with ankylosis seen on the left at C2-3 through C4-5, and on the right at C2-3 and C3-4.  Prominent degenerative changes about the C1-2 articulation and skull base. Upper chest: Visualized upper chest demonstrates no acute finding. Visualized lung apices are clear. Centrilobular and paraseptal emphysematous changes noted. Other: None. IMPRESSION: 1. No acute traumatic injury within the cervical spine. 2. Moderate multilevel cervical spondylolysis, most notable at C5-6 and C6-7. 3. 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 4. Emphysema. Electronically Signed   By: Rise Mu M.D.   On: 03/04/2019 00:32   Dg C-arm 1-60 Min  Result Date: 03/04/2019 CLINICAL DATA:  Intramedullary nail placement. Postop. EXAM: RIGHT TIBIA AND FIBULA - 2 VIEW; DG C-ARM 61-120 MIN COMPARISON:  None. FINDINGS: Intramedullary nail has been placed across a spiral distal tibial shaft fracture, with lateral displacement. Satisfactory and improved position and alignment. Fibular shaft fracture was better seen on the preoperative radiographs. IMPRESSION: As above. Electronically Signed   By: Robert Calderon M.D.   On: 03/04/2019 20:09    Disposition: Discharge disposition: 01-Home or Self Care         Follow-up  Information    Tarry Kos, MD In 2 weeks.   Specialty:  Orthopedic Surgery Why:  For suture removal, For wound re-check Contact information: [address] [place] [address] [phone]            Signed: Cristie Hem 03/07/2019, 7:35 AM

## 2019-03-07 NOTE — Progress Notes (Signed)
Subjective: 3 Days Post-Op Procedure(s) (LRB): INTRAMEDULLARY (IM) NAIL TIBIAL (Right) Patient reports pain as mild.  Resting comfortably  Objective: Vital signs in last 24 hours: Temp:  [98.6 F (37 C)-98.8 F (37.1 C)] 98.6 F (37 C) (03/30 0507) Pulse Rate:  [75-86] 75 (03/30 0507) Resp:  [15-16] 16 (03/30 0507) BP: (84-109)/(49-69) 84/49 (03/30 0507) SpO2:  [97 %-100 %] 97 % (03/30 0507)  Intake/Output from previous day: 03/29 0701 - 03/30 0700 In: 840 [P.O.:840] Out: 475 [Urine:475] Intake/Output this shift: No intake/output data recorded.  Recent Labs    03/06/19 1847  HGB 11.5*   Recent Labs    03/06/19 1847  WBC 4.5  RBC 4.11*  HCT 34.8*  PLT 220   Recent Labs    03/06/19 1847  NA 130*  K 3.5  CL 96*  CO2 26  BUN 13  CREATININE 1.09  GLUCOSE 152*  CALCIUM 8.2*   No results for input(s): LABPT, INR in the last 72 hours.  Neurologically intact Neurovascular intact Sensation intact distally Intact pulses distally Dorsiflexion/Plantar flexion intact Incision: dressing C/D/I No cellulitis present Compartment soft  Splint in place Ehl/fhl intact   Assessment/Plan: 3 Days Post-Op Procedure(s) (LRB): INTRAMEDULLARY (IM) NAIL TIBIAL (Right) Up with therapy D/C IV fluids NWB RLE D/c home today if possible as he does not have insurance and would not be approved for snf.  Patient does state that his sister has agreed to let him stay at her house, but she will not come pick him up.  Issue will be getting to her house     Cristie Hem 03/07/2019, 7:33 AM

## 2019-03-07 NOTE — Progress Notes (Addendum)
Physical Therapy Treatment Patient Details Name: Robert Calderon MRN: [id number] DOB: [birth year] Today's Date: 03/07/2019    History of Present Illness Pt is a 63 y/o male admitted following fall down steps. + ETOH. Imaging revealed a R tib/fib fx and pt is s/p R IM nail placement. No pertinent PMH on file.     PT Comments    Patient seen for mobility progression. Pt is progressing well toward PT goals and reports decreased pain today with mobility. Recommending HHPT for further skilled PT services to maximize independence and safety with mobility.     Follow Up Recommendations  Supervision for mobility/OOB;Home health PT     Equipment Recommendations  Rolling walker with 5" wheels;3in1 (PT)    Recommendations for Other Services       Precautions / Restrictions Precautions Precautions: Fall Restrictions Weight Bearing Restrictions: Yes RLE Weight Bearing: Non weight bearing    Mobility  Bed Mobility Overal bed mobility: Independent                Transfers Overall transfer level: Needs assistance Equipment used: Rolling walker (2 wheeled) Transfers: Sit to/from [employer] Transfers Sit to Stand: Min guard         General transfer comment: cues for safe hand placement and positioning to maintain NWB R LE; min guard for safety  Ambulation/Gait Ambulation/Gait assistance: Supervision Gait Distance (Feet): 60 Feet Assistive device: Rolling walker (2 wheeled) Gait Pattern/deviations: Step-to pattern Gait velocity: decreased   General Gait Details: cues for safe proximity to RW; good maintainance of weight bearing precautions   Stairs             Wheelchair Mobility    Modified Rankin (Stroke Patients Only)       Balance Overall balance assessment: Needs assistance Sitting-balance support: No upper extremity supported;Feet supported Sitting balance-Leahy Scale: Good     Standing balance support: Bilateral upper extremity  supported;During functional activity Standing balance-Leahy Scale: Poor Standing balance comment: Reliant on BUE support                             Cognition Arousal/Alertness: Awake/alert Behavior During Therapy: WFL for tasks assessed/performed Overall Cognitive Status: Within Functional Limits for tasks assessed                                        Exercises      General Comments General comments (skin integrity, edema, etc.): pt educated on positioning for optimal R knee ROM while healing and to keep LE elevated while resting       Pertinent Vitals/Pain Pain Assessment: Faces Faces Pain Scale: Hurts little more Pain Location: RLE  Pain Descriptors / Indicators: Aching;Guarding;Sore Pain Intervention(s): Limited activity within patient's tolerance;Monitored during session;Premedicated before session;Repositioned    Home Living                      Prior Function            PT Goals (current goals can now be found in the care plan section) Acute Rehab PT Goals Patient Stated Goal: to decrease pain  Progress towards PT goals: Progressing toward goals    Frequency    Min 3X/week      PT Plan Discharge plan needs to be updated    Co-evaluation  AM-PAC PT "6 Clicks" Mobility   Outcome Measure  Help needed turning from your back to your side while in a flat bed without using bedrails?: None Help needed moving from lying on your back to sitting on the side of a flat bed without using bedrails?: None Help needed moving to and from a bed to a chair (including a wheelchair)?: A Little Help needed standing up from a chair using your arms (e.g., wheelchair or bedside chair)?: A Little Help needed to walk in hospital room?: A Little Help needed climbing 3-5 steps with a railing? : A Lot 6 Click Score: 19    End of Session Equipment Utilized During Treatment: Gait belt Activity Tolerance: Patient tolerated  treatment well Patient left: in chair;with call bell/phone within reach Nurse Communication: Mobility status PT Visit Diagnosis: Other abnormalities of gait and mobility (R26.89);Pain;Difficulty in walking, not elsewhere classified (R26.2) Pain - Right/Left: Right Pain - part of body: Leg     Time: 1053-1110 PT Time Calculation (min) (ACUTE ONLY): 17 min  Charges:  $Gait Training: 8-22 mins                     Erline Levine, PTA Acute Rehabilitation Services Pager: [phone] Office: [phone]     Carolynne Edouard 03/07/2019, 4:10 PM

## 2019-03-09 ENCOUNTER — Telehealth (INDEPENDENT_AMBULATORY_CARE_PROVIDER_SITE_OTHER): Payer: Self-pay | Admitting: Orthopaedic Surgery

## 2019-03-09 NOTE — Telephone Encounter (Signed)
Please advise 

## 2019-03-09 NOTE — Telephone Encounter (Signed)
Robert Calderon from [facility] called requesting VO for [facility] PT for 1x a week for 3 weeks.  He also needs clarification on any flexion restrictions, removing the ace wrap and the use of aspirin.  Patient has not filled any RX due to not being able to afford them.  CB#[phone].  Thank you.

## 2019-03-10 NOTE — Telephone Encounter (Signed)
1. yes to HHPT 2. needs to take aspirin 81 mg bid for dvt ppx 3. no ROM restrictions.  Only restriction is NWB

## 2019-03-10 NOTE — Telephone Encounter (Signed)
Noted! Thank you

## 2019-03-10 NOTE — Telephone Encounter (Signed)
fyi  I called Rolm Gala and advised. He also wanted to change frequency of HHPT to 1 wk 1 and 2 wk 2.  I advised ok to change.

## 2019-03-23 ENCOUNTER — Encounter (INDEPENDENT_AMBULATORY_CARE_PROVIDER_SITE_OTHER): Payer: Self-pay | Admitting: Orthopaedic Surgery

## 2019-03-23 ENCOUNTER — Ambulatory Visit (INDEPENDENT_AMBULATORY_CARE_PROVIDER_SITE_OTHER): Payer: Self-pay | Admitting: Physician Assistant

## 2019-03-23 ENCOUNTER — Other Ambulatory Visit (INDEPENDENT_AMBULATORY_CARE_PROVIDER_SITE_OTHER): Payer: Self-pay | Admitting: Radiology

## 2019-03-23 ENCOUNTER — Ambulatory Visit (INDEPENDENT_AMBULATORY_CARE_PROVIDER_SITE_OTHER): Payer: Self-pay

## 2019-03-23 ENCOUNTER — Other Ambulatory Visit: Payer: Self-pay

## 2019-03-23 DIAGNOSIS — S82201A Unspecified fracture of shaft of right tibia, initial encounter for closed fracture: Secondary | ICD-10-CM

## 2019-03-23 DIAGNOSIS — S82401A Unspecified fracture of shaft of right fibula, initial encounter for closed fracture: Principal | ICD-10-CM

## 2019-03-23 NOTE — Progress Notes (Signed)
   Post-Op Visit Note   Patient: Robert Calderon           Date of Birth: 1956/07/08           MRN: [id number] Visit Date: 03/23/2019 PCP: Patient, No Pcp Per   Assessment & Plan:  Chief Complaint:  Chief Complaint  Patient presents with  . Right Lower Leg - Routine Post Op   Visit Diagnoses:  1. Tibia/fibula fracture, right, closed, initial encounter     Plan: Patient is a pleasant 63 year old gentleman who presents our clinic today 19 days status post IM nail right tibia, date of surgery 03/04/2019.  He has been doing well.  No fevers or chills.  No significant pain.  Slight soreness to the distal tibia.  He has been compliant nonweightbearing.  Examination of his right lower extremity reveals well healing surgical incisions with nylon sutures in place.  He does have slight swelling.  Calf and compartments are soft and nontender.  He has moderate tenderness over the distal fracture site.  EHL and FHL intact.  Today, nylon sutures were removed.  The patient will remain nonweightbearing for another 4 weeks.  We will go ahead and initiate home health physical therapy for gentle range of motion as tolerated.  He will follow-up with [place] in 4 weeks time for repeat evaluation and x-rays.  Call with concerns or questions in the meantime.  Follow-Up Instructions: Return in about 4 weeks (around 04/20/2019).   Orders:  Orders Placed This Encounter  Procedures  . XR Tibia/Fibula Right   No orders of the defined types were placed in this encounter.   Imaging: Xr Tibia/fibula Right  Result Date: 03/23/2019 X-rays demonstrate stable alignment of the fracture with evidence of callus formation   PMFS History: Patient Active Problem List   Diagnosis Date Noted  . Tibia/fibula fracture, right, closed, initial encounter 03/04/2019   History reviewed. No pertinent past medical history.  History reviewed. No pertinent family history.  Past Surgical History:  Procedure Laterality Date  . TIBIA  IM NAIL INSERTION Right 03/04/2019   Procedure: INTRAMEDULLARY (IM) NAIL TIBIAL;  Surgeon: Tarry Kos, MD;  Location: [facility] OR;  Service: Orthopedics;  Laterality: Right;   Social History   Occupational History  . Not on file  Tobacco Use  . Smoking status: Current Every Day Smoker  . Smokeless tobacco: Never Used  Substance and Sexual Activity  . Alcohol use: Yes  . Drug use: No  . Sexual activity: Not on file

## 2019-07-15 ENCOUNTER — Other Ambulatory Visit: Payer: Self-pay

## 2019-07-15 DIAGNOSIS — Z20822 Contact with and (suspected) exposure to covid-19: Secondary | ICD-10-CM

## 2019-07-16 LAB — NOVEL CORONAVIRUS, NAA: SARS-CoV-2, NAA: NOT DETECTED

## 2020-10-09 ENCOUNTER — Encounter (HOSPITAL_COMMUNITY): Payer: Self-pay | Admitting: Emergency Medicine

## 2020-10-09 ENCOUNTER — Emergency Department (HOSPITAL_COMMUNITY)
Admission: EM | Admit: 2020-10-09 | Discharge: 2020-10-09 | Disposition: A | Discharge status: Home / Self Care | Payer: Self-pay | Attending: Emergency Medicine | Admitting: Emergency Medicine

## 2020-10-09 ENCOUNTER — Other Ambulatory Visit: Payer: Self-pay

## 2020-10-09 DIAGNOSIS — F17200 Nicotine dependence, unspecified, uncomplicated: Secondary | ICD-10-CM | POA: Insufficient documentation

## 2020-10-09 DIAGNOSIS — R109 Unspecified abdominal pain: Secondary | ICD-10-CM | POA: Insufficient documentation

## 2020-10-09 LAB — URINALYSIS, ROUTINE W REFLEX MICROSCOPIC
Bacteria, UA: NONE SEEN
Bilirubin Urine: NEGATIVE
Glucose, UA: 50 mg/dL — AB
Ketones, ur: NEGATIVE mg/dL
Nitrite: NEGATIVE
Protein, ur: NEGATIVE mg/dL
Specific Gravity, Urine: 1.014 (ref 1.005–1.030)
pH: 5 (ref 5.0–8.0)

## 2020-10-09 LAB — CBC
HCT: 39.4 % (ref 39.0–52.0)
Hemoglobin: 13 g/dL (ref 13.0–17.0)
MCH: 29.1 pg (ref 26.0–34.0)
MCHC: 33 g/dL (ref 30.0–36.0)
MCV: 88.1 fL (ref 80.0–100.0)
Platelets: 185 10*3/uL (ref 150–400)
RBC: 4.47 MIL/uL (ref 4.22–5.81)
RDW: 11.9 % (ref 11.5–15.5)
WBC: 4.2 10*3/uL (ref 4.0–10.5)
nRBC: 0 % (ref 0.0–0.2)

## 2020-10-09 LAB — BASIC METABOLIC PANEL
Anion gap: 11 (ref 5–15)
BUN: 7 mg/dL — ABNORMAL LOW (ref 8–23)
CO2: 24 mmol/L (ref 22–32)
Calcium: 9.2 mg/dL (ref 8.9–10.3)
Chloride: 101 mmol/L (ref 98–111)
Creatinine, Ser: 0.97 mg/dL (ref 0.61–1.24)
GFR, Estimated: >60 mL/min (ref >60)
Glucose, Bld: 146 mg/dL — ABNORMAL HIGH (ref 70–99)
Potassium: 3.5 mmol/L (ref 3.5–5.1)
Sodium: 136 mmol/L (ref 135–145)

## 2020-10-09 NOTE — Discharge Instructions (Addendum)
Return to the ER for any worsening or concerning symptoms.

## 2020-10-09 NOTE — ED Provider Notes (Signed)
[facility] EMERGENCY DEPARTMENT Provider Note   CSN: [id number] Arrival date & time: 10/09/20  5732     History Chief Complaint  Patient presents with  . Flank Pain    Robert Calderon is a 64 y.o. male.  64 year old male presents with complaint of left side abdominal pain, onset this morning, worse with movement. Denies associated SHOB, CP, nausea, vomiting, changes in bowel or bladder habits. No history of similar pain previously, denies falls or injuries. Patient states pain has since improved and he would like to be discharged.         History reviewed. No pertinent past medical history.  Patient Active Problem List   Diagnosis Date Noted  . Tibia/fibula fracture, right, closed, initial encounter 03/04/2019    Past Surgical History:  Procedure Laterality Date  . TIBIA IM NAIL INSERTION Right 03/04/2019   Procedure: INTRAMEDULLARY (IM) NAIL TIBIAL;  Surgeon: Tarry Kos, MD;  Location: [facility] OR;  Service: Orthopedics;  Laterality: Right;       No family history on file.  Social History   Tobacco Use  . Smoking status: Current Every Day Smoker  . Smokeless tobacco: Never Used  Substance Use Topics  . Alcohol use: Yes  . Drug use: No    Home Medications Prior to Admission medications   Medication Sig Start Date End Date Taking? Authorizing Provider  calcium-vitamin D (OSCAL WITH D) 500-200 MG-UNIT tablet Take 1 tablet by mouth 3 (three) times daily. 03/06/19   Tarry Kos, MD  methocarbamol (ROBAXIN) 750 MG tablet Take 1 tablet (750 mg total) by mouth 2 (two) times daily as needed for muscle spasms. 03/06/19   Tarry Kos, MD  ondansetron (ZOFRAN) 4 MG tablet Take 1-2 tablets (4-8 mg total) by mouth every 8 (eight) hours as needed for nausea or vomiting. 03/06/19   Tarry Kos, MD  oxyCODONE-acetaminophen (PERCOCET) 5-325 MG tablet Take 1-2 tablets by mouth every 8 (eight) hours as needed for severe pain. 03/06/19   Tarry Kos, MD  zinc  sulfate 220 (50 Zn) MG capsule Take 1 capsule (220 mg total) by mouth daily. 03/06/19   Tarry Kos, MD    Allergies    Patient has no known allergies.  Review of Systems   Review of Systems  Constitutional: Negative for chills and fever.  Respiratory: Negative for shortness of breath.   Cardiovascular: Negative for chest pain.  Gastrointestinal: Positive for abdominal pain. Negative for constipation, diarrhea, nausea and vomiting.  Genitourinary: Negative for dysuria, frequency and hematuria.  Musculoskeletal: Negative for back pain.  Skin: Negative for rash and wound.  Neurological: Negative for weakness.    Physical Exam Updated Vital Signs BP (!) 161/99 (BP Location: Left Arm)   Pulse 83   Temp 98.5 F (36.9 C) (Oral)   Resp 16   SpO2 99%   Physical Exam Vitals and nursing note reviewed.  Constitutional:      General: He is not in acute distress.    Appearance: He is well-developed. He is not diaphoretic.  HENT:     Head: Normocephalic and atraumatic.  Cardiovascular:     Rate and Rhythm: Normal rate and regular rhythm.     Pulses: Normal pulses.     Heart sounds: Normal heart sounds.  Pulmonary:     Effort: Pulmonary effort is normal.     Breath sounds: Normal breath sounds.  Abdominal:     Palpations: Abdomen is soft.  Tenderness: There is abdominal tenderness in the left upper quadrant and left lower quadrant. There is no right CVA tenderness, left CVA tenderness, guarding or rebound.  Musculoskeletal:     Thoracic back: No tenderness or bony tenderness.     Lumbar back: No tenderness or bony tenderness.  Skin:    General: Skin is warm and dry.     Findings: No erythema or rash.  Neurological:     Mental Status: He is alert and oriented to person, place, and time.  Psychiatric:        Behavior: Behavior normal.     ED Results / Procedures / Treatments   Labs (all labs ordered are listed, but only abnormal results are displayed) Labs Reviewed    URINALYSIS, ROUTINE W REFLEX MICROSCOPIC - Abnormal; Notable for the following components:      Result Value   Glucose, UA 50 (*)    Hgb urine dipstick SMALL (*)    Leukocytes,Ua TRACE (*)    All other components within normal limits  BASIC METABOLIC PANEL - Abnormal; Notable for the following components:   Glucose, Bld 146 (*)    BUN 7 (*)    All other components within normal limits  CBC    EKG None  Radiology No results found.  Procedures Procedures (including critical care time)  Medications Ordered in ED Medications - No data to display  ED Course  I have reviewed the triage vital signs and the nursing notes.  Pertinent labs & imaging results that were available during my care of the patient were reviewed by me and considered in my medical decision making (see chart for details).  Clinical Course as of Oct 09 1213  Tue Oct 09, 2020  [id number] 64 year old male with complaint of left side abdominal pain onset this morning, worse with movement, pain has since significantly improved and patient would like to go home.  On exam, patient is found to have left-sided abdominal tenderness without guarding or rebound.  Reviewed patient's labs including CBC and BMP as well as urinalysis with patient.  CBC does not show leukocytosis or anemia, BMP with normal renal function, urinalysis with small hemoglobin and trace leukocytes without urinary symptoms.  Recommend CT abdomen pelvis for complaint of left side abdominal pain.  Patient declines at this time stating he feels better but will return to the emergency room if needed.   [LM]    Clinical Course User Index [LM] Alden Hipp   MDM Rules/Calculators/A&P                          Final Clinical Impression(s) / ED Diagnoses Final diagnoses:  Abdominal pain, unspecified abdominal location    Rx / DC Orders ED Discharge Orders    None       Jeannie Fend, PA-C 10/09/20 1214    Terrilee Files, MD 10/09/20  [phone]

## 2020-10-09 NOTE — ED Triage Notes (Signed)
Patient here with left flank pain for the last four days.  Patient denies any nausea or vomiting at this time.  Denies any urinary symptoms.

## 2020-12-07 ENCOUNTER — Emergency Department (HOSPITAL_COMMUNITY): Payer: Medicaid Other

## 2020-12-07 ENCOUNTER — Inpatient Hospital Stay (HOSPITAL_COMMUNITY)
Admission: EM | Admit: 2020-12-07 | Discharge: 2021-01-08 | DRG: 963 | Disposition: E | Payer: Medicaid Other | Attending: Surgery | Admitting: Surgery

## 2020-12-07 ENCOUNTER — Inpatient Hospital Stay (HOSPITAL_COMMUNITY): Payer: Medicaid Other

## 2020-12-07 DIAGNOSIS — N179 Acute kidney failure, unspecified: Principal | ICD-10-CM

## 2020-12-07 DIAGNOSIS — R0902 Hypoxemia: Secondary | ICD-10-CM

## 2020-12-07 DIAGNOSIS — Z452 Encounter for adjustment and management of vascular access device: Secondary | ICD-10-CM

## 2020-12-07 DIAGNOSIS — J939 Pneumothorax, unspecified: Secondary | ICD-10-CM

## 2020-12-07 DIAGNOSIS — J9690 Respiratory failure, unspecified, unspecified whether with hypoxia or hypercapnia: Secondary | ICD-10-CM

## 2020-12-07 DIAGNOSIS — T07XXXA Unspecified multiple injuries, initial encounter: Secondary | ICD-10-CM | POA: Diagnosis present

## 2020-12-07 DIAGNOSIS — S52511A Displaced fracture of right radial styloid process, initial encounter for closed fracture: Secondary | ICD-10-CM | POA: Diagnosis present

## 2020-12-07 DIAGNOSIS — S06819A Injury of right internal carotid artery, intracranial portion, not elsewhere classified with loss of consciousness of unspecified duration, initial encounter: Secondary | ICD-10-CM | POA: Diagnosis present

## 2020-12-07 DIAGNOSIS — S2243XA Multiple fractures of ribs, bilateral, initial encounter for closed fracture: Secondary | ICD-10-CM | POA: Diagnosis present

## 2020-12-07 DIAGNOSIS — S270XXA Traumatic pneumothorax, initial encounter: Secondary | ICD-10-CM | POA: Diagnosis present

## 2020-12-07 DIAGNOSIS — Z7189 Other specified counseling: Secondary | ICD-10-CM | POA: Diagnosis not present

## 2020-12-07 DIAGNOSIS — S22028A Other fracture of second thoracic vertebra, initial encounter for closed fracture: Secondary | ICD-10-CM | POA: Diagnosis present

## 2020-12-07 DIAGNOSIS — S066X9A Traumatic subarachnoid hemorrhage with loss of consciousness of unspecified duration, initial encounter: Principal | ICD-10-CM | POA: Diagnosis present

## 2020-12-07 DIAGNOSIS — Z66 Do not resuscitate: Secondary | ICD-10-CM | POA: Diagnosis not present

## 2020-12-07 DIAGNOSIS — S82832B Other fracture of upper and lower end of left fibula, initial encounter for open fracture type I or II: Secondary | ICD-10-CM | POA: Diagnosis present

## 2020-12-07 DIAGNOSIS — I248 Other forms of acute ischemic heart disease: Secondary | ICD-10-CM | POA: Diagnosis not present

## 2020-12-07 DIAGNOSIS — S27321A Contusion of lung, unilateral, initial encounter: Secondary | ICD-10-CM | POA: Diagnosis present

## 2020-12-07 DIAGNOSIS — R578 Other shock: Secondary | ICD-10-CM | POA: Diagnosis present

## 2020-12-07 DIAGNOSIS — N170 Acute kidney failure with tubular necrosis: Secondary | ICD-10-CM | POA: Diagnosis present

## 2020-12-07 DIAGNOSIS — I998 Other disorder of circulatory system: Secondary | ICD-10-CM | POA: Diagnosis present

## 2020-12-07 DIAGNOSIS — S42201A Unspecified fracture of upper end of right humerus, initial encounter for closed fracture: Secondary | ICD-10-CM

## 2020-12-07 DIAGNOSIS — J9601 Acute respiratory failure with hypoxia: Secondary | ICD-10-CM | POA: Diagnosis present

## 2020-12-07 DIAGNOSIS — J90 Pleural effusion, not elsewhere classified: Secondary | ICD-10-CM | POA: Diagnosis present

## 2020-12-07 DIAGNOSIS — Z9911 Dependence on respirator [ventilator] status: Secondary | ICD-10-CM

## 2020-12-07 DIAGNOSIS — S32038A Other fracture of third lumbar vertebra, initial encounter for closed fracture: Secondary | ICD-10-CM | POA: Diagnosis present

## 2020-12-07 DIAGNOSIS — S12490A Other displaced fracture of fifth cervical vertebra, initial encounter for closed fracture: Secondary | ICD-10-CM | POA: Diagnosis present

## 2020-12-07 DIAGNOSIS — S42391B Other fracture of shaft of right humerus, initial encounter for open fracture: Secondary | ICD-10-CM | POA: Diagnosis present

## 2020-12-07 DIAGNOSIS — S62521A Displaced fracture of distal phalanx of right thumb, initial encounter for closed fracture: Secondary | ICD-10-CM | POA: Diagnosis present

## 2020-12-07 DIAGNOSIS — S82392B Other fracture of lower end of left tibia, initial encounter for open fracture type I or II: Secondary | ICD-10-CM | POA: Diagnosis present

## 2020-12-07 DIAGNOSIS — T797XXA Traumatic subcutaneous emphysema, initial encounter: Secondary | ICD-10-CM | POA: Diagnosis present

## 2020-12-07 DIAGNOSIS — E861 Hypovolemia: Secondary | ICD-10-CM | POA: Diagnosis present

## 2020-12-07 DIAGNOSIS — I82442 Acute embolism and thrombosis of left tibial vein: Secondary | ICD-10-CM | POA: Diagnosis present

## 2020-12-07 DIAGNOSIS — I82432 Acute embolism and thrombosis of left popliteal vein: Secondary | ICD-10-CM | POA: Diagnosis present

## 2020-12-07 DIAGNOSIS — S42201B Unspecified fracture of upper end of right humerus, initial encounter for open fracture: Secondary | ICD-10-CM | POA: Diagnosis present

## 2020-12-07 DIAGNOSIS — S3289XB Fracture of other parts of pelvis, initial encounter for open fracture: Secondary | ICD-10-CM | POA: Diagnosis present

## 2020-12-07 DIAGNOSIS — S32048A Other fracture of fourth lumbar vertebra, initial encounter for closed fracture: Secondary | ICD-10-CM | POA: Diagnosis present

## 2020-12-07 DIAGNOSIS — S22018A Other fracture of first thoracic vertebra, initial encounter for closed fracture: Secondary | ICD-10-CM | POA: Diagnosis present

## 2020-12-07 DIAGNOSIS — Y92413 State road as the place of occurrence of the external cause: Secondary | ICD-10-CM | POA: Diagnosis not present

## 2020-12-07 DIAGNOSIS — K661 Hemoperitoneum: Secondary | ICD-10-CM | POA: Diagnosis present

## 2020-12-07 DIAGNOSIS — S36113A Laceration of liver, unspecified degree, initial encounter: Secondary | ICD-10-CM | POA: Diagnosis present

## 2020-12-07 DIAGNOSIS — Z20822 Contact with and (suspected) exposure to covid-19: Secondary | ICD-10-CM | POA: Diagnosis present

## 2020-12-07 DIAGNOSIS — K7200 Acute and subacute hepatic failure without coma: Secondary | ICD-10-CM | POA: Diagnosis present

## 2020-12-07 DIAGNOSIS — I7771 Dissection of carotid artery: Secondary | ICD-10-CM | POA: Diagnosis present

## 2020-12-07 DIAGNOSIS — S22081A Stable burst fracture of T11-T12 vertebra, initial encounter for closed fracture: Secondary | ICD-10-CM | POA: Diagnosis present

## 2020-12-07 DIAGNOSIS — S12390A Other displaced fracture of fourth cervical vertebra, initial encounter for closed fracture: Secondary | ICD-10-CM | POA: Diagnosis present

## 2020-12-07 DIAGNOSIS — S32058A Other fracture of fifth lumbar vertebra, initial encounter for closed fracture: Secondary | ICD-10-CM | POA: Diagnosis present

## 2020-12-07 DIAGNOSIS — I82452 Acute embolism and thrombosis of left peroneal vein: Secondary | ICD-10-CM | POA: Diagnosis present

## 2020-12-07 DIAGNOSIS — E875 Hyperkalemia: Secondary | ICD-10-CM | POA: Diagnosis present

## 2020-12-07 DIAGNOSIS — S332XXA Dislocation of sacroiliac and sacrococcygeal joint, initial encounter: Secondary | ICD-10-CM | POA: Diagnosis present

## 2020-12-07 DIAGNOSIS — S065X9A Traumatic subdural hemorrhage with loss of consciousness of unspecified duration, initial encounter: Secondary | ICD-10-CM | POA: Diagnosis present

## 2020-12-07 DIAGNOSIS — S062X9A Diffuse traumatic brain injury with loss of consciousness of unspecified duration, initial encounter: Secondary | ICD-10-CM | POA: Diagnosis present

## 2020-12-07 DIAGNOSIS — Z515 Encounter for palliative care: Secondary | ICD-10-CM | POA: Diagnosis not present

## 2020-12-07 DIAGNOSIS — R4182 Altered mental status, unspecified: Secondary | ICD-10-CM | POA: Diagnosis present

## 2020-12-07 DIAGNOSIS — S82202A Unspecified fracture of shaft of left tibia, initial encounter for closed fracture: Secondary | ICD-10-CM

## 2020-12-07 DIAGNOSIS — E872 Acidosis: Secondary | ICD-10-CM | POA: Diagnosis present

## 2020-12-07 DIAGNOSIS — I959 Hypotension, unspecified: Secondary | ICD-10-CM | POA: Diagnosis present

## 2020-12-07 DIAGNOSIS — T796XXA Traumatic ischemia of muscle, initial encounter: Secondary | ICD-10-CM | POA: Diagnosis present

## 2020-12-07 LAB — CBC
HCT: 37.1 % — ABNORMAL LOW (ref 39.0–52.0)
Hemoglobin: 11.7 g/dL — ABNORMAL LOW (ref 13.0–17.0)
MCH: 28.4 pg (ref 26.0–34.0)
MCHC: 31.5 g/dL (ref 30.0–36.0)
MCV: 90 fL (ref 80.0–100.0)
Platelets: 258 10*3/uL (ref 150–400)
RBC: 4.12 MIL/uL — ABNORMAL LOW (ref 4.22–5.81)
RDW: 12.2 % (ref 11.5–15.5)
WBC: 8.3 10*3/uL (ref 4.0–10.5)
nRBC: 0.5 % — ABNORMAL HIGH (ref 0.0–0.2)

## 2020-12-07 LAB — I-STAT CHEM 8, ED
BUN: 10 mg/dL (ref 8–23)
Calcium, Ion: 1 mmol/L — ABNORMAL LOW (ref 1.15–1.40)
Chloride: 103 mmol/L (ref 98–111)
Creatinine, Ser: 1.7 mg/dL — ABNORMAL HIGH (ref 0.61–1.24)
Glucose, Bld: 142 mg/dL — ABNORMAL HIGH (ref 70–99)
HCT: 38 % — ABNORMAL LOW (ref 39.0–52.0)
Hemoglobin: 12.9 g/dL — ABNORMAL LOW (ref 13.0–17.0)
Potassium: 4.1 mmol/L (ref 3.5–5.1)
Sodium: 137 mmol/L (ref 135–145)
TCO2: 23 mmol/L (ref 22–32)

## 2020-12-07 LAB — URINALYSIS, ROUTINE W REFLEX MICROSCOPIC
Bilirubin Urine: NEGATIVE
Glucose, UA: 50 mg/dL — AB
Ketones, ur: NEGATIVE mg/dL
Leukocytes,Ua: NEGATIVE
Nitrite: NEGATIVE
Protein, ur: 30 mg/dL — AB
RBC / HPF: >50 RBC/hpf — ABNORMAL HIGH (ref 0–5)
Specific Gravity, Urine: 1.015 (ref 1.005–1.030)
pH: 6 (ref 5.0–8.0)

## 2020-12-07 LAB — PREPARE PLATELET PHERESIS: Unit division: 0

## 2020-12-07 LAB — BPAM PLATELET PHERESIS
Blood Product Expiration Date: 202201032359
ISSUE DATE / TIME: 202112311935
Unit Type and Rh: 6200

## 2020-12-07 LAB — I-STAT ARTERIAL BLOOD GAS, ED
Acid-base deficit: 7 mmol/L — ABNORMAL HIGH (ref 0.0–2.0)
Bicarbonate: 18.4 mmol/L — ABNORMAL LOW (ref 20.0–28.0)
Calcium, Ion: 0.91 mmol/L — ABNORMAL LOW (ref 1.15–1.40)
HCT: 30 % — ABNORMAL LOW (ref 39.0–52.0)
Hemoglobin: 10.2 g/dL — ABNORMAL LOW (ref 13.0–17.0)
O2 Saturation: 100 %
Patient temperature: 95.9
Potassium: 3.9 mmol/L (ref 3.5–5.1)
Sodium: 138 mmol/L (ref 135–145)
TCO2: 19 mmol/L — ABNORMAL LOW (ref 22–32)
pCO2 arterial: 34.3 mmHg (ref 32.0–48.0)
pH, Arterial: 7.329 — ABNORMAL LOW (ref 7.350–7.450)
pO2, Arterial: 315 mmHg — ABNORMAL HIGH (ref 83.0–108.0)

## 2020-12-07 LAB — ABO/RH: ABO/RH(D): A POS

## 2020-12-07 LAB — ETHANOL: Alcohol, Ethyl (B): 326 mg/dL (ref <10)

## 2020-12-07 LAB — RESP PANEL BY RT-PCR (FLU A&B, COVID) ARPGX2
Influenza A by PCR: NEGATIVE
Influenza B by PCR: NEGATIVE
SARS Coronavirus 2 by RT PCR: NEGATIVE

## 2020-12-07 LAB — COMPREHENSIVE METABOLIC PANEL
ALT: 114 U/L — ABNORMAL HIGH (ref 0–44)
AST: 309 U/L — ABNORMAL HIGH (ref 15–41)
Albumin: 3 g/dL — ABNORMAL LOW (ref 3.5–5.0)
Alkaline Phosphatase: 93 U/L (ref 38–126)
Anion gap: 11 (ref 5–15)
BUN: 9 mg/dL (ref 8–23)
CO2: 20 mmol/L — ABNORMAL LOW (ref 22–32)
Calcium: 7.9 mg/dL — ABNORMAL LOW (ref 8.9–10.3)
Chloride: 104 mmol/L (ref 98–111)
Creatinine, Ser: 1.29 mg/dL — ABNORMAL HIGH (ref 0.61–1.24)
GFR, Estimated: 37 mL/min — ABNORMAL LOW (ref >60)
Glucose, Bld: 150 mg/dL — ABNORMAL HIGH (ref 70–99)
Potassium: 3.9 mmol/L (ref 3.5–5.1)
Sodium: 135 mmol/L (ref 135–145)
Total Bilirubin: 0.1 mg/dL — ABNORMAL LOW (ref 0.3–1.2)
Total Protein: 7.3 g/dL (ref 6.5–8.1)

## 2020-12-07 LAB — LACTIC ACID, PLASMA
Lactic Acid, Venous: 2.8 mmol/L (ref 0.5–1.9)
Lactic Acid, Venous: 5.1 mmol/L (ref 0.5–1.9)

## 2020-12-07 LAB — PROTIME-INR
INR: 1.1 (ref 0.8–1.2)
Prothrombin Time: 14.2 seconds (ref 11.4–15.2)
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
## 2020-12-07 MED ORDER — TRANEXAMIC ACID FOR EPISTAXIS
500.0000 mg | Freq: Once | TOPICAL | Status: DC
  Administered 2020-12-07: 500 mg via TOPICAL

## 2020-12-07 MED ORDER — DOCUSATE SODIUM 50 MG/5ML PO LIQD
100.0000 mg | Freq: Two times a day (BID) | ORAL | Status: DC
  Administered 2020-12-08 (×2): 100 mg
  Filled 2020-12-07 (×3): qty 10

## 2020-12-07 MED ORDER — TRANEXAMIC ACID-NACL 1000-0.7 MG/100ML-% IV SOLN
1000.0000 mg | Freq: Once | INTRAVENOUS | Status: AC
  Administered 2020-12-07: 1000 mg via INTRAVENOUS
  Filled 2020-12-07: qty 100

## 2020-12-07 MED ORDER — ASPIRIN 81 MG PO CHEW: 81.0000 mg | CHEWABLE_TABLET | Freq: Every day | ORAL | Status: DC

## 2020-12-07 MED ORDER — HYDROMORPHONE HCL 1 MG/ML IJ SOLN: 1.0000 mg | INTRAMUSCULAR | Status: DC | PRN

## 2020-12-07 MED ORDER — SODIUM CHLORIDE 0.9 % IV SOLN: INTRAVENOUS | Status: DC

## 2020-12-07 MED ORDER — ASPIRIN EC 81 MG PO TBEC
81.0000 mg | DELAYED_RELEASE_TABLET | ORAL | Status: AC
  Administered 2020-12-08: 81 mg via ORAL
  Filled 2020-12-07: qty 1

## 2020-12-07 MED ORDER — CEFAZOLIN SODIUM-DEXTROSE 2-4 GM/100ML-% IV SOLN
2.0000 g | Freq: Three times a day (TID) | INTRAVENOUS | Status: AC
  Administered 2020-12-08 (×3): 2 g via INTRAVENOUS
  Filled 2020-12-07 (×4): qty 100

## 2020-12-07 MED ORDER — CEFAZOLIN SODIUM-DEXTROSE 2-4 GM/100ML-% IV SOLN
2.0000 g | Freq: Once | INTRAVENOUS | Status: AC
  Administered 2020-12-07: 2 g via INTRAVENOUS

## 2020-12-07 MED ORDER — IOHEXOL 300 MG/ML  SOLN
100.0000 mL | Freq: Once | INTRAMUSCULAR | Status: AC | PRN
  Administered 2020-12-07: 100 mL via INTRAVENOUS

## 2020-12-07 MED ORDER — POLYETHYLENE GLYCOL 3350 17 G PO PACK
17.0000 g | PACK | Freq: Every day | ORAL | Status: DC
  Administered 2020-12-08: 17 g
  Filled 2020-12-07: qty 1

## 2020-12-07 MED ORDER — ROCURONIUM BROMIDE 50 MG/5ML IV SOLN
INTRAVENOUS | Status: AC | PRN
Start: 2020-12-07 — End: 2020-12-07
  Administered 2020-12-07: 80 mg via INTRAVENOUS

## 2020-12-07 MED ORDER — ETOMIDATE 2 MG/ML IV SOLN
INTRAVENOUS | Status: AC | PRN
Start: 2020-12-07 — End: 2020-12-07
  Administered 2020-12-07: 20 mg via INTRAVENOUS

## 2020-12-07 NOTE — Progress Notes (Signed)
Orthopedic Tech Progress Note Patient Details:  Robert Calderon 12/08/1875 [id number]  Ortho Devices Type of Ortho Device: Stirrup splint Ortho Device/Splint Location: LLE Ortho Device/Splint Interventions: Ordered,Application   Post Interventions Patient Tolerated: Other (comment) Instructions Provided: Other (comment)   [name] 12-10-2020, 7:39 PM
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
## 2020-12-07 NOTE — Progress Notes (Signed)
Level 1 Trauma-patient not available-  Jan 01, 2021 2200  Clinical Encounter Type  Visited With Patient not available  Phebe Colla, Chaplain

## 2020-12-07 NOTE — ED Notes (Signed)
Pelvic binder placed.
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
## 2020-12-07 NOTE — ED Notes (Signed)
Orthopedic MD with technician at bedside .

## 2020-12-07 NOTE — ED Notes (Signed)
Report given to [facility] charge nurse , patient currently at MRI will transport to ICU after imaging .
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
## 2020-12-07 NOTE — Progress Notes (Signed)
Orthopedic Tech Progress Note Patient Details:  Robert Calderon 05/30/1956 [id number] Assisted Dr. Roda Shutters with coaptation, posterior long leg and stirrup splints  Ortho Devices Type of Ortho Device: Coapt,Post (long leg) splint,Stirrup splint Ortho Device/Splint Location: Right Arm, Left Leg Ortho Device/Splint Interventions: Application   Post Interventions Patient Tolerated: Other (comment) Instructions Provided: Other (comment)   Robert Calderon Robert Calderon 12/05/2020, 11:09 PM

## 2020-12-07 NOTE — Progress Notes (Signed)
Patient transported to CT and back without complications.  
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
## 2020-12-07 NOTE — ED Notes (Signed)
Assumed care on patient , ETT/OGT intact , pelvic binder and temp foley catheter intact , IV sites unremarkable . Tranexamic Acid IV drip infusing .

## 2020-12-07 NOTE — ED Notes (Signed)
Patient transported to CT 

## 2020-12-07 NOTE — ED Notes (Signed)
Patient transported to MRI 

## 2020-12-07 NOTE — ED Notes (Signed)
The sister would like an update she's the only contact for this patient  her name is sheila and [phone]

## 2020-12-08 ENCOUNTER — Inpatient Hospital Stay (HOSPITAL_COMMUNITY): Payer: Medicaid Other

## 2020-12-08 ENCOUNTER — Inpatient Hospital Stay (HOSPITAL_COMMUNITY): Payer: Medicaid Other | Admitting: Certified Registered"

## 2020-12-08 ENCOUNTER — Encounter (HOSPITAL_COMMUNITY): Admission: EM | Disposition: E | Payer: Self-pay | Source: Home / Self Care

## 2020-12-08 DIAGNOSIS — T07XXXA Unspecified multiple injuries, initial encounter: Secondary | ICD-10-CM

## 2020-12-08 DIAGNOSIS — I248 Other forms of acute ischemic heart disease: Secondary | ICD-10-CM

## 2020-12-08 LAB — BASIC METABOLIC PANEL
Anion gap: 21 — ABNORMAL HIGH (ref 5–15)
Anion gap: 15 (ref 5–15)
BUN: 13 mg/dL (ref 8–23)
BUN: 21 mg/dL (ref 8–23)
CO2: 13 mmol/L — ABNORMAL LOW (ref 22–32)
CO2: 15 mmol/L — ABNORMAL LOW (ref 22–32)
Calcium: 7.3 mg/dL — ABNORMAL LOW (ref 8.9–10.3)
Calcium: 6.3 mg/dL — CL (ref 8.9–10.3)
Chloride: 106 mmol/L (ref 98–111)
Chloride: 109 mmol/L (ref 98–111)
Creatinine, Ser: 2.22 mg/dL — ABNORMAL HIGH (ref 0.61–1.24)
Creatinine, Ser: 3.3 mg/dL — ABNORMAL HIGH (ref 0.61–1.24)
GFR, Estimated: 32 mL/min — ABNORMAL LOW (ref >60)
GFR, Estimated: 20 mL/min — ABNORMAL LOW (ref >60)
Glucose, Bld: 139 mg/dL — ABNORMAL HIGH (ref 70–99)
Glucose, Bld: 140 mg/dL — ABNORMAL HIGH (ref 70–99)
Potassium: 3.6 mmol/L (ref 3.5–5.1)
Potassium: 5.4 mmol/L — ABNORMAL HIGH (ref 3.5–5.1)
Sodium: 140 mmol/L (ref 135–145)
Sodium: 139 mmol/L (ref 135–145)

## 2020-12-08 LAB — PREPARE FRESH FROZEN PLASMA
Unit division: 0
Unit division: 0
Unit division: 0
Unit division: 0
Unit division: 0
Unit division: 0
Unit division: 0
Unit division: 0

## 2020-12-08 LAB — POCT I-STAT 7, (LYTES, BLD GAS, ICA,H+H)
Acid-base deficit: 18 mmol/L — ABNORMAL HIGH (ref 0.0–2.0)
Acid-base deficit: 16 mmol/L — ABNORMAL HIGH (ref 0.0–2.0)
Acid-base deficit: 15 mmol/L — ABNORMAL HIGH (ref 0.0–2.0)
Bicarbonate: 9.6 mmol/L — ABNORMAL LOW (ref 20.0–28.0)
Bicarbonate: 10.6 mmol/L — ABNORMAL LOW (ref 20.0–28.0)
Bicarbonate: 11.9 mmol/L — ABNORMAL LOW (ref 20.0–28.0)
Calcium, Ion: 0.95 mmol/L — ABNORMAL LOW (ref 1.15–1.40)
Calcium, Ion: 0.91 mmol/L — ABNORMAL LOW (ref 1.15–1.40)
Calcium, Ion: 0.83 mmol/L — CL (ref 1.15–1.40)
HCT: 30 % — ABNORMAL LOW (ref 39.0–52.0)
HCT: 26 % — ABNORMAL LOW (ref 39.0–52.0)
HCT: 24 % — ABNORMAL LOW (ref 39.0–52.0)
Hemoglobin: 10.2 g/dL — ABNORMAL LOW (ref 13.0–17.0)
Hemoglobin: 8.8 g/dL — ABNORMAL LOW (ref 13.0–17.0)
Hemoglobin: 8.2 g/dL — ABNORMAL LOW (ref 13.0–17.0)
O2 Saturation: 86 %
O2 Saturation: 85 %
O2 Saturation: 90 %
Patient temperature: 100
Patient temperature: 99
Patient temperature: 37
Potassium: 5.5 mmol/L — ABNORMAL HIGH (ref 3.5–5.1)
Potassium: 6.3 mmol/L (ref 3.5–5.1)
Potassium: 6.8 mmol/L (ref 3.5–5.1)
Sodium: 137 mmol/L (ref 135–145)
Sodium: 136 mmol/L (ref 135–145)
Sodium: 138 mmol/L (ref 135–145)
TCO2: 10 mmol/L — ABNORMAL LOW (ref 22–32)
TCO2: 11 mmol/L — ABNORMAL LOW (ref 22–32)
TCO2: 13 mmol/L — ABNORMAL LOW (ref 22–32)
pCO2 arterial: 27.9 mmHg — ABNORMAL LOW (ref 32.0–48.0)
pCO2 arterial: 28.4 mmHg — ABNORMAL LOW (ref 32.0–48.0)
pCO2 arterial: 30.1 mmHg — ABNORMAL LOW (ref 32.0–48.0)
pH, Arterial: 7.15 — CL (ref 7.350–7.450)
pH, Arterial: 7.18 — CL (ref 7.350–7.450)
pH, Arterial: 7.205 — ABNORMAL LOW (ref 7.350–7.450)
pO2, Arterial: 68 mmHg — ABNORMAL LOW (ref 83.0–108.0)
pO2, Arterial: 62 mmHg — ABNORMAL LOW (ref 83.0–108.0)
pO2, Arterial: 69 mmHg — ABNORMAL LOW (ref 83.0–108.0)

## 2020-12-08 LAB — BPAM FFP
Blood Product Expiration Date: 202201012359
Blood Product Expiration Date: 202201052359
Blood Product Expiration Date: 202201052359
Blood Product Expiration Date: 202201052359
Blood Product Expiration Date: 202201052359
Blood Product Expiration Date: 202201052359
Blood Product Expiration Date: 202201052359
Blood Product Expiration Date: 202201052359
Blood Product Expiration Date: 202201122359
Blood Product Expiration Date: 202201132359
Blood Product Expiration Date: 202201172359
Blood Product Expiration Date: 202201232359
ISSUE DATE / TIME: 202112311922
ISSUE DATE / TIME: 202112311922
ISSUE DATE / TIME: 202112311927
ISSUE DATE / TIME: 202112311927
ISSUE DATE / TIME: 202112311935
ISSUE DATE / TIME: 202112311935
ISSUE DATE / TIME: 202112311940
ISSUE DATE / TIME: 202112311940
ISSUE DATE / TIME: 202201011242
ISSUE DATE / TIME: 202201011426
Unit Type and Rh: 2800
Unit Type and Rh: 600
Unit Type and Rh: 600
Unit Type and Rh: 6200
Unit Type and Rh: 6200
Unit Type and Rh: 6200
Unit Type and Rh: 6200
Unit Type and Rh: 6200
Unit Type and Rh: 6200
Unit Type and Rh: 6200
Unit Type and Rh: 6200
Unit Type and Rh: 6200

## 2020-12-08 LAB — PROTIME-INR
INR: 1.4 — ABNORMAL HIGH (ref 0.8–1.2)
Prothrombin Time: 16.4 seconds — ABNORMAL HIGH (ref 11.4–15.2)

## 2020-12-08 LAB — COMPREHENSIVE METABOLIC PANEL
ALT: 1181 U/L — ABNORMAL HIGH (ref 0–44)
AST: 9268 U/L — ABNORMAL HIGH (ref 15–41)
Albumin: 1.6 g/dL — ABNORMAL LOW (ref 3.5–5.0)
Alkaline Phosphatase: 75 U/L (ref 38–126)
Anion gap: 14 (ref 5–15)
BUN: 18 mg/dL (ref 8–23)
CO2: 13 mmol/L — ABNORMAL LOW (ref 22–32)
Calcium: 5.2 mg/dL — CL (ref 8.9–10.3)
Chloride: 112 mmol/L — ABNORMAL HIGH (ref 98–111)
Creatinine, Ser: 3.09 mg/dL — ABNORMAL HIGH (ref 0.61–1.24)
GFR, Estimated: 22 mL/min — ABNORMAL LOW (ref >60)
Glucose, Bld: 175 mg/dL — ABNORMAL HIGH (ref 70–99)
Potassium: 6.3 mmol/L (ref 3.5–5.1)
Sodium: 139 mmol/L (ref 135–145)
Total Bilirubin: 1.6 mg/dL — ABNORMAL HIGH (ref 0.3–1.2)
Total Protein: 3.6 g/dL — ABNORMAL LOW (ref 6.5–8.1)

## 2020-12-08 LAB — CBC
HCT: 37.1 % — ABNORMAL LOW (ref 39.0–52.0)
HCT: 28.9 % — ABNORMAL LOW (ref 39.0–52.0)
HCT: 36.2 % — ABNORMAL LOW (ref 39.0–52.0)
Hemoglobin: 12.2 g/dL — ABNORMAL LOW (ref 13.0–17.0)
Hemoglobin: 9.2 g/dL — ABNORMAL LOW (ref 13.0–17.0)
Hemoglobin: 12.1 g/dL — ABNORMAL LOW (ref 13.0–17.0)
MCH: 29.2 pg (ref 26.0–34.0)
MCH: 28.8 pg (ref 26.0–34.0)
MCH: 29 pg (ref 26.0–34.0)
MCHC: 32.9 g/dL (ref 30.0–36.0)
MCHC: 31.8 g/dL (ref 30.0–36.0)
MCHC: 33.4 g/dL (ref 30.0–36.0)
MCV: 88.8 fL (ref 80.0–100.0)
MCV: 90.3 fL (ref 80.0–100.0)
MCV: 86.8 fL (ref 80.0–100.0)
Platelets: 127 10*3/uL — ABNORMAL LOW (ref 150–400)
Platelets: 84 10*3/uL — ABNORMAL LOW (ref 150–400)
Platelets: 63 10*3/uL — ABNORMAL LOW (ref 150–400)
RBC: 4.18 MIL/uL — ABNORMAL LOW (ref 4.22–5.81)
RBC: 3.2 MIL/uL — ABNORMAL LOW (ref 4.22–5.81)
RBC: 4.17 MIL/uL — ABNORMAL LOW (ref 4.22–5.81)
RDW: 13.5 % (ref 11.5–15.5)
RDW: 14.6 % (ref 11.5–15.5)
RDW: 14.5 % (ref 11.5–15.5)
WBC: 11.9 10*3/uL — ABNORMAL HIGH (ref 4.0–10.5)
WBC: 11.8 10*3/uL — ABNORMAL HIGH (ref 4.0–10.5)
WBC: 7.8 10*3/uL (ref 4.0–10.5)
nRBC: 0 % (ref 0.0–0.2)
nRBC: 0.3 % — ABNORMAL HIGH (ref 0.0–0.2)
nRBC: 0.4 % — ABNORMAL HIGH (ref 0.0–0.2)

## 2020-12-08 LAB — ECHOCARDIOGRAM LIMITED
Height: 72 in
Weight: 2560.86 oz

## 2020-12-08 LAB — LACTIC ACID, PLASMA
Lactic Acid, Venous: >11 mmol/L (ref 0.5–1.9)
Lactic Acid, Venous: 10.4 mmol/L (ref 0.5–1.9)

## 2020-12-08 LAB — MAGNESIUM: Magnesium: 1.6 mg/dL — ABNORMAL LOW (ref 1.7–2.4)

## 2020-12-08 LAB — TRAUMA TEG PANEL
CFF Max Amplitude: 14 mm — ABNORMAL LOW (ref 15–32)
Citrated Kaolin (R): 8.5 min (ref 4.6–9.1)
Citrated Rapid TEG (MA): 50.8 mm — ABNORMAL LOW (ref 52–70)
Lysis at 30 Minutes: 0 % (ref 0.0–2.6)

## 2020-12-08 LAB — BPAM CRYOPRECIPITATE
Blood Product Expiration Date: 202201010138
ISSUE DATE / TIME: 202112311958
Unit Type and Rh: 6200

## 2020-12-08 LAB — SURGICAL PCR SCREEN
MRSA, PCR: NEGATIVE
Staphylococcus aureus: NEGATIVE

## 2020-12-08 LAB — PREPARE CRYOPRECIPITATE: Unit division: 0

## 2020-12-08 LAB — PHOSPHORUS: Phosphorus: 8.2 mg/dL — ABNORMAL HIGH (ref 2.5–4.6)

## 2020-12-08 LAB — HIV ANTIBODY (ROUTINE TESTING W REFLEX): HIV Screen 4th Generation wRfx: NONREACTIVE

## 2020-12-08 LAB — PREPARE RBC (CROSSMATCH)

## 2020-12-08 LAB — CK: Total CK: 28567 U/L — ABNORMAL HIGH (ref 49–397)

## 2020-12-08 LAB — MRSA PCR SCREENING: MRSA by PCR: NEGATIVE

## 2020-12-08 IMAGING — DX DG CHEST 1V PORT
1 series · 1 of 1 positions shown · non-contrast
Comparison: [DATE], [DATE]

CLINICAL DATA: Central line placement

EXAM:
PORTABLE CHEST 1 VIEW

[chest ap]
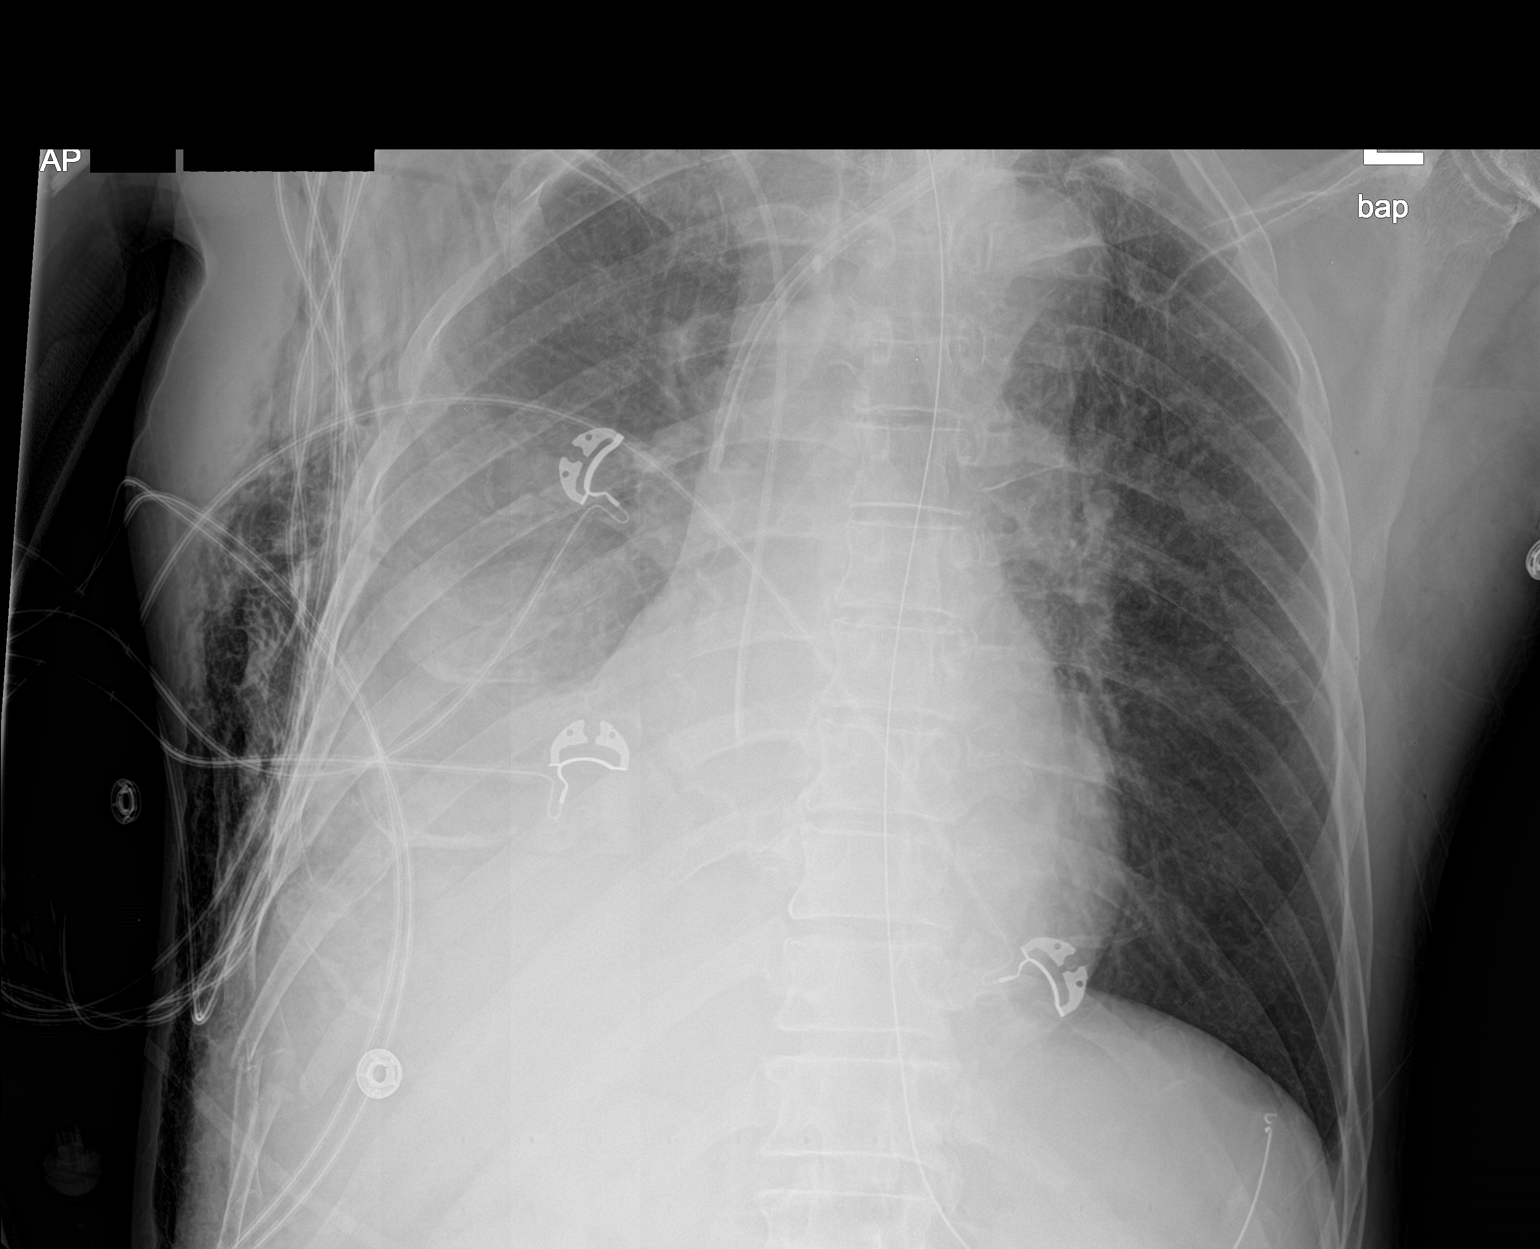

[1 of 1 positions shown; findings below may reference images not displayed]

FINDINGS: Endotracheal tube tip is about 6.6 cm superior to the carina and is
at the upper margin of the clavicles. Esophageal tube tip in the
left upper quadrant. Right-sided central venous catheter tip over
the right atrium. New left-sided central venous catheter with tip
over the SVC. Moderate chest wall emphysema on the right. Asymmetric
hazy opacification right thorax likely due to layering effusion.
Persistent dense consolidation at the right middle lobe and right
base. Left first and multiple right rib fractures are again noted.
IMPRESSION: 1. New left-sided central venous catheter with tip over the SVC. No
pneumothorax.
2. Endotracheal tube tip about 6.6 cm superior to carina.
3. Continued hazy opacification of the right thorax likely due to
layering effusion and atelectasis/infiltrate at the right middle
lobe and right base.
4. Moderate right chest wall emphysema with multiple rib fractures.

## 2020-12-08 IMAGING — DX DG CHEST 1V PORT
1 series · 2 of 2 positions shown · non-contrast
Comparison: Chest x-ray [DATE], chest CT [DATE]

CLINICAL DATA: Pneumothorax.

EXAM:
PORTABLE CHEST 1 VIEW

[Series 1: chest · 0.14mm/px · 2 of 2 slices shown]
[im 1/2]
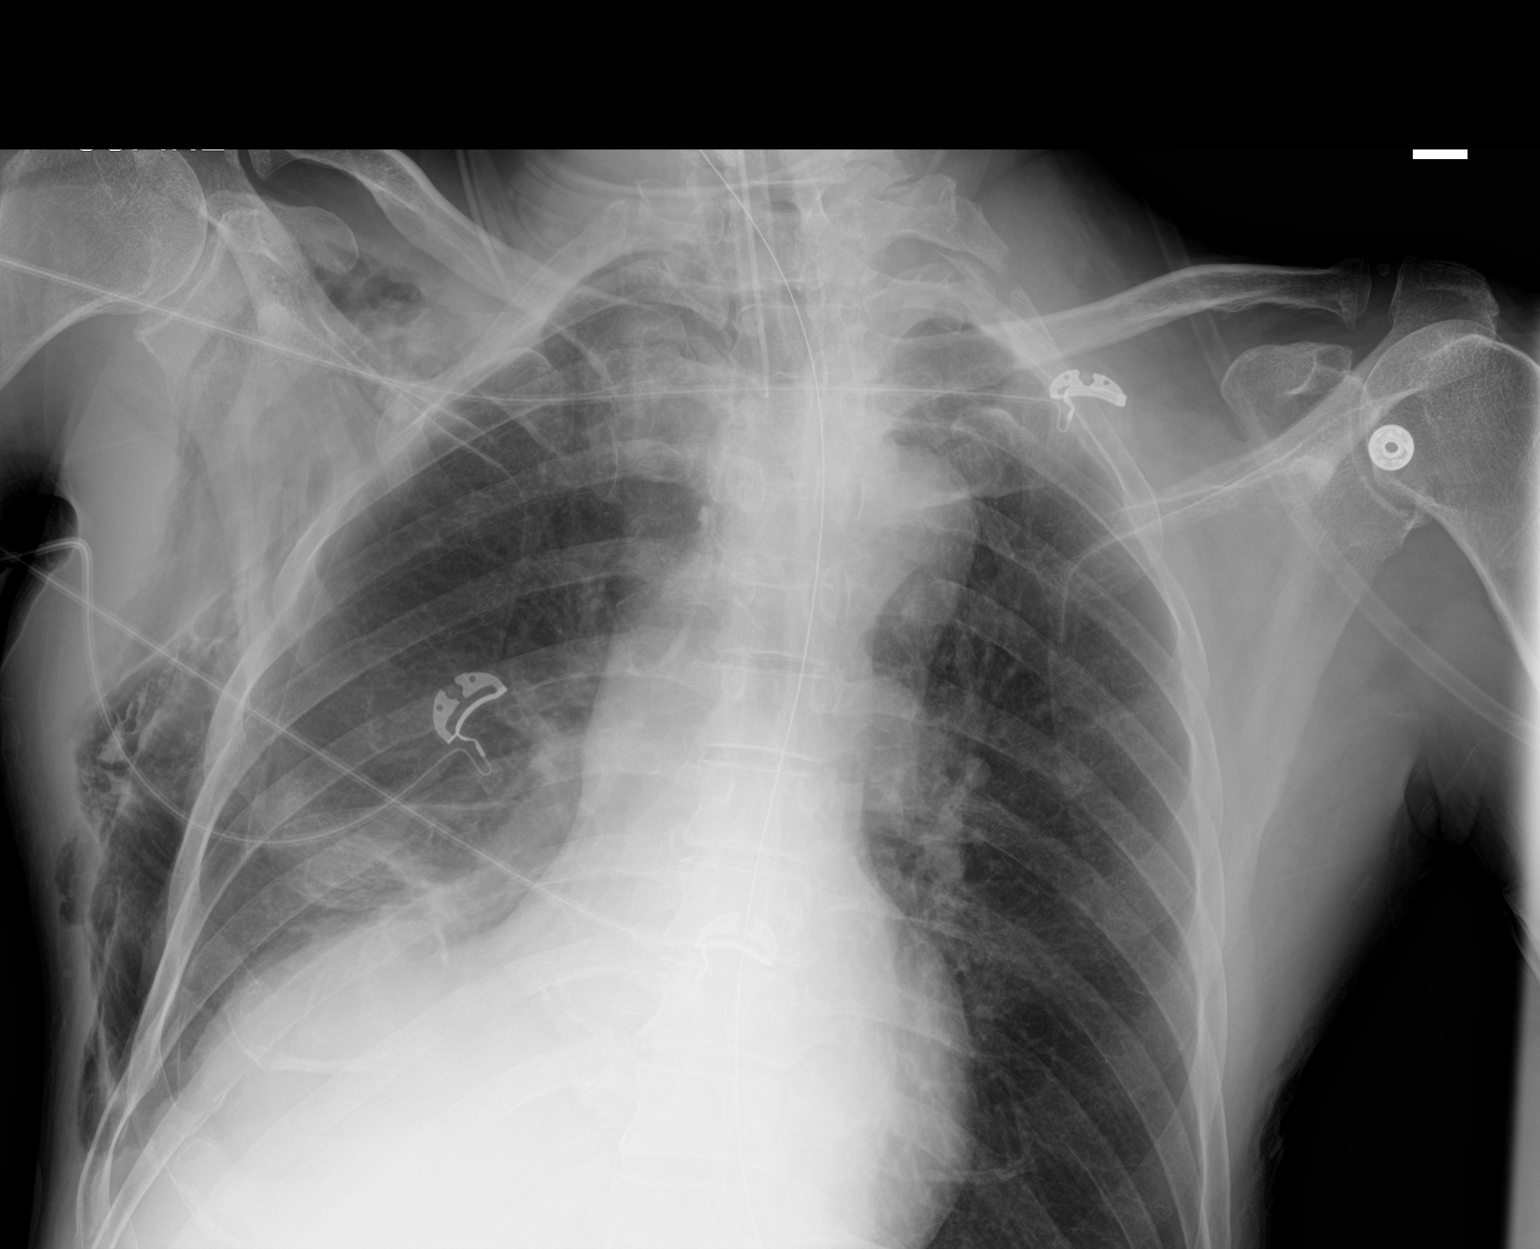
[im 2/2]
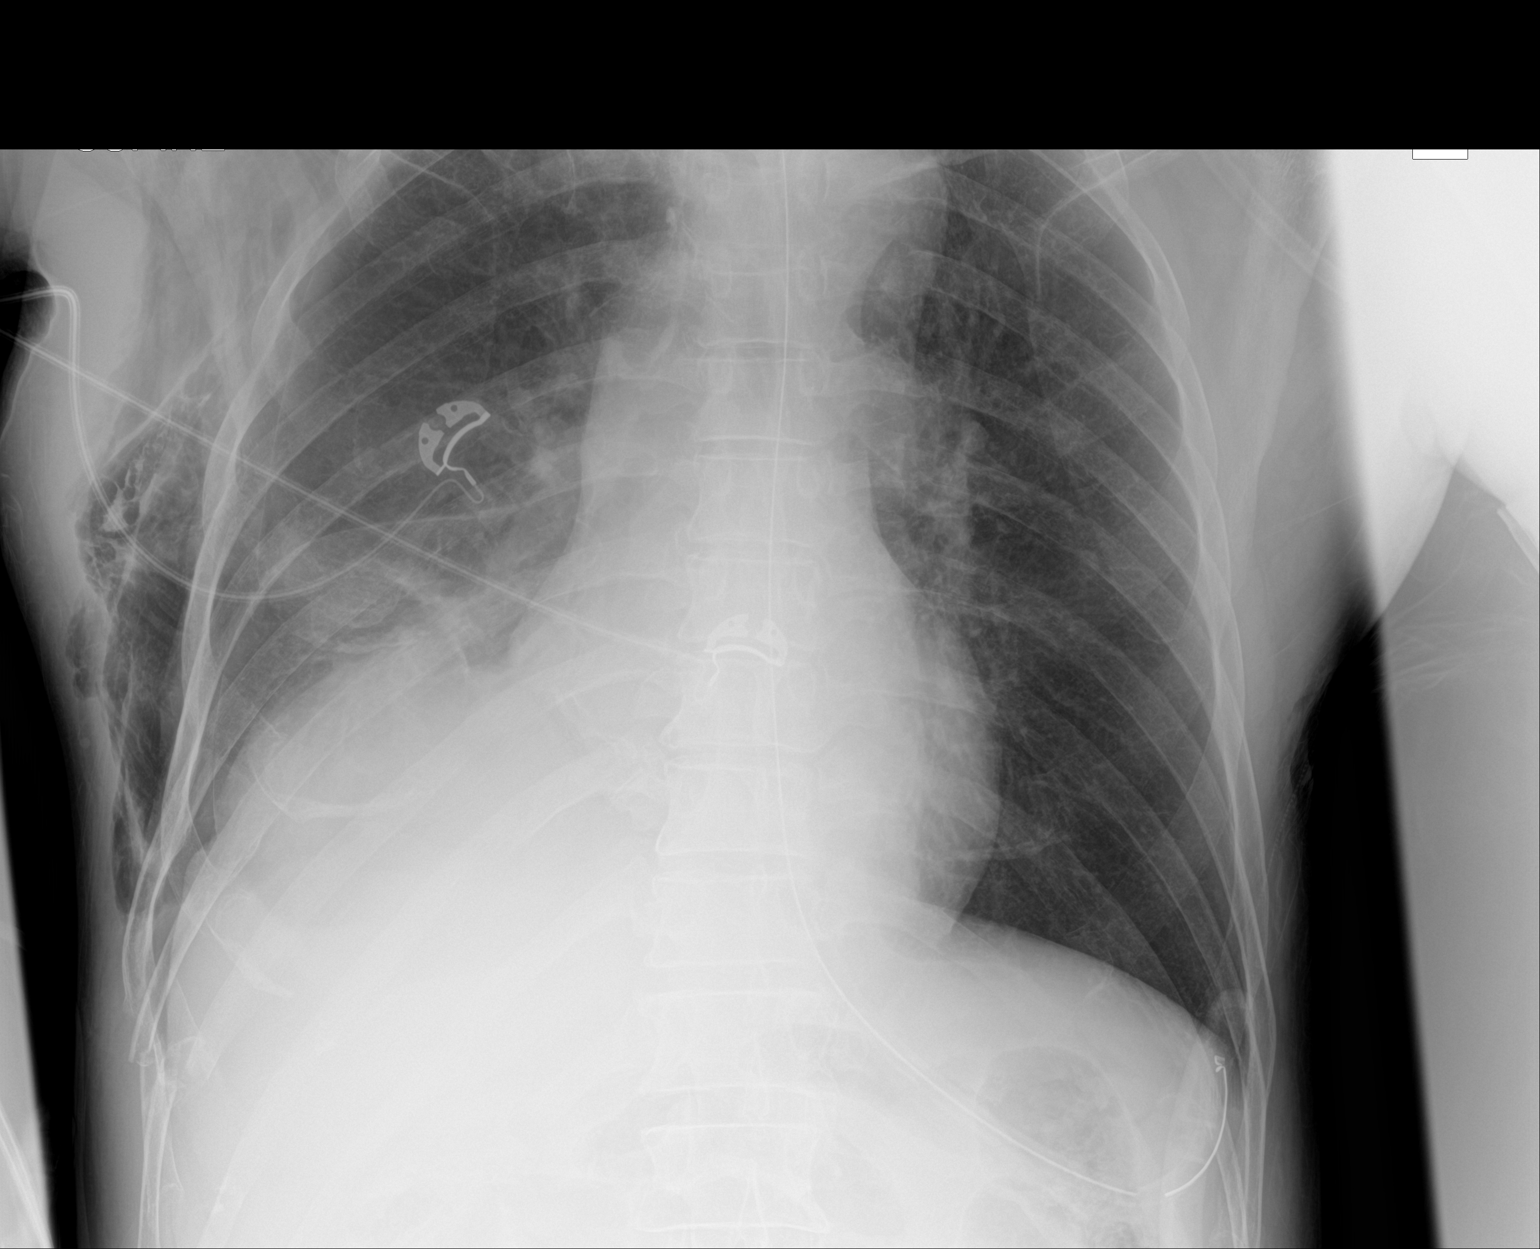

[2 of 2 positions shown; findings below may reference images not displayed]

FINDINGS: The heart size and mediastinal contours are stable. Endotracheal
tube is seen unchanged in good position. Nasogastric tube is
identified with distal tip in the proximal stomach. Small right
pneumothorax is identified. Consolidation of medial right lung base
is noted. Subcutaneous emphysema over the right chest is noted. The
left lung is clear. The visualized skeletal structures are stable.
IMPRESSION: 1. Small right pneumothorax identified.
2. Consolidation of medial right lung base.

## 2020-12-08 IMAGING — DX DG CHEST 1V PORT
1 series · 1 of 1 positions shown · non-contrast
Comparison: Same day.

CLINICAL DATA: Oxygen desaturation.

EXAM:
PORTABLE CHEST 1 VIEW

[chest]
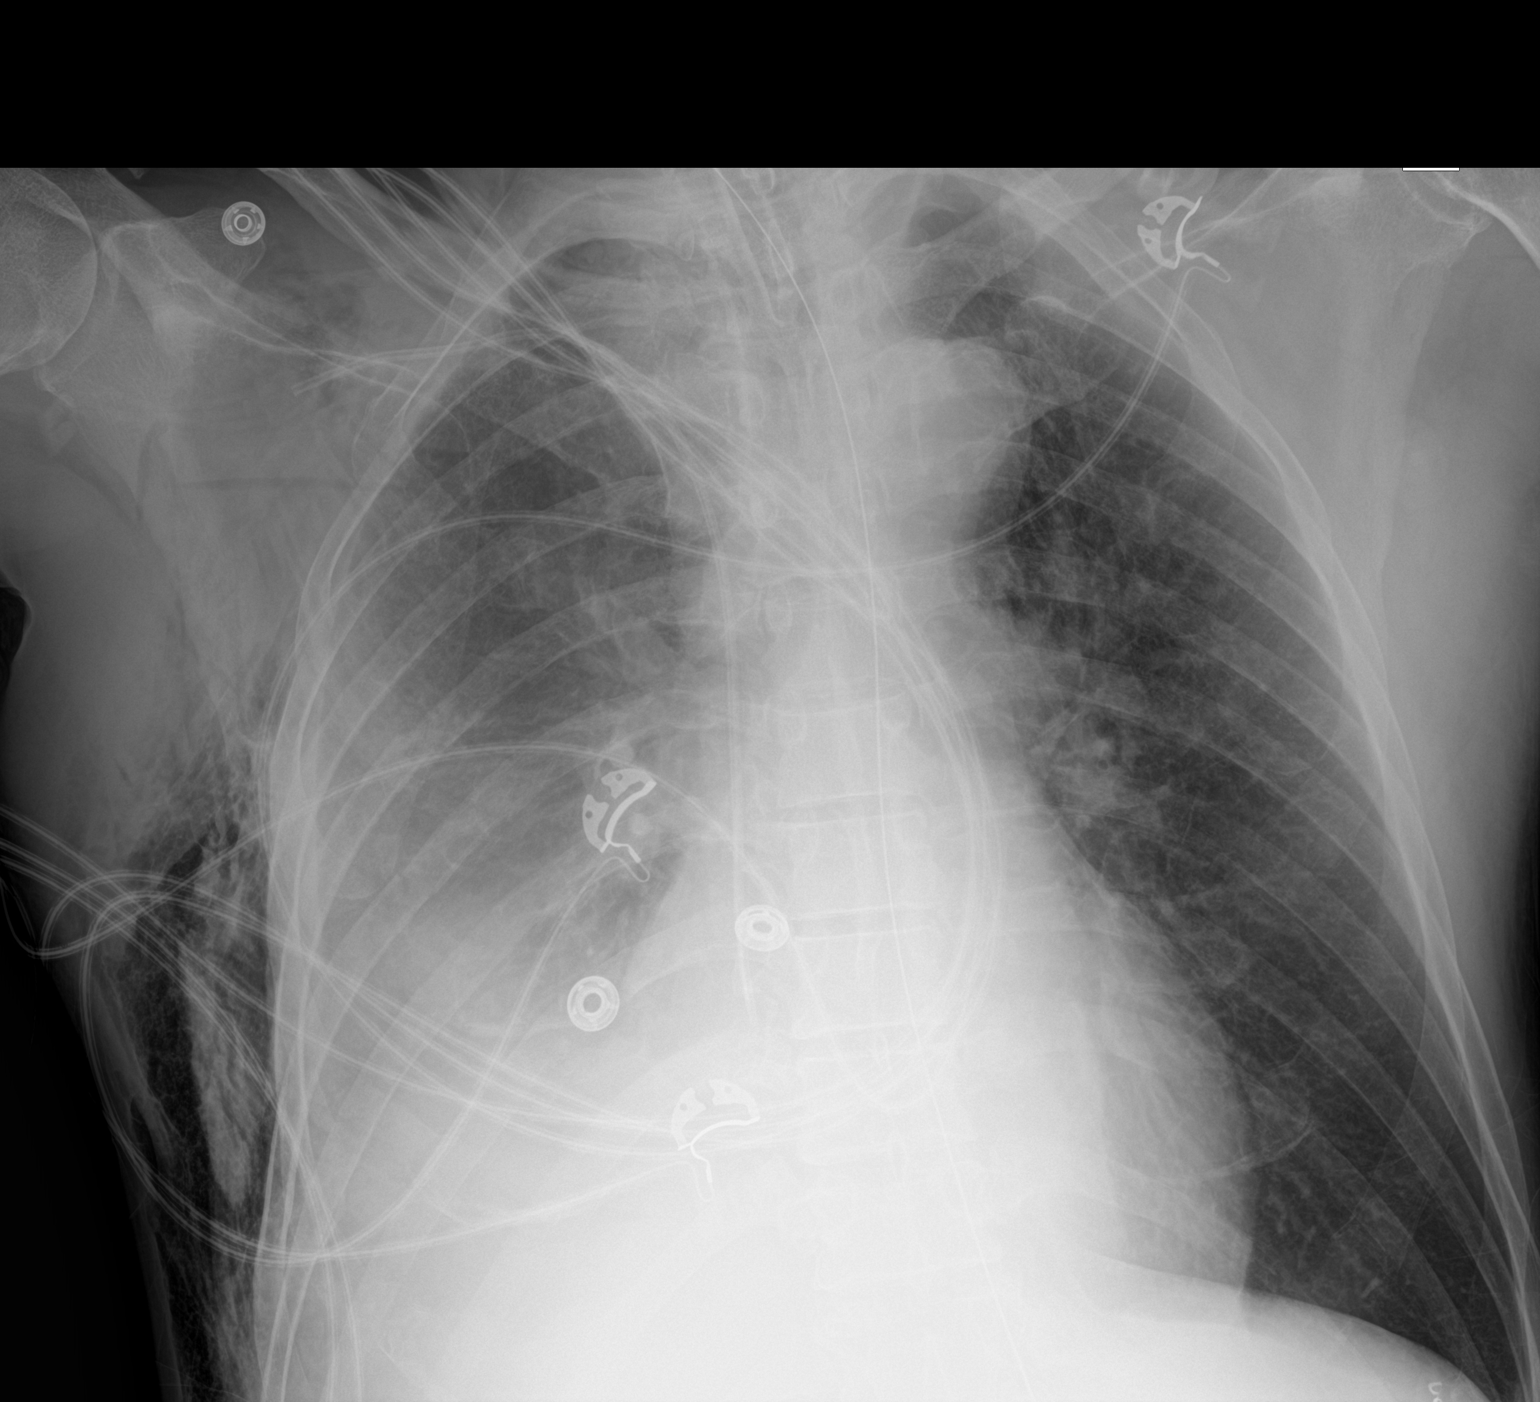

[1 of 1 positions shown; findings below may reference images not displayed]

FINDINGS: Stable cardiomediastinal silhouette. Endotracheal and nasogastric
tubes are unchanged in position. Left lung is clear. No definite
pneumothorax is noted. Right subclavian catheter is unchanged.
Stable right pleural effusion is noted with associated right basilar
atelectasis or infiltrate. Subcutaneous emphysema is seen overlying
the right lateral chest wall. Bony thorax is unremarkable.
IMPRESSION: 1. Stable support apparatus. Stable right pleural effusion with
associated right basilar atelectasis or infiltrate. No definite
pneumothorax is noted.
2. Subcutaneous emphysema is seen overlying right lateral chest
wall.

## 2020-12-08 IMAGING — DX DG CHEST 1V PORT
1 series · 1 of 1 positions shown · non-contrast
Comparison: [DATE]

CLINICAL DATA: Central line placement

EXAM:
PORTABLE CHEST 1 VIEW

[chest ap]
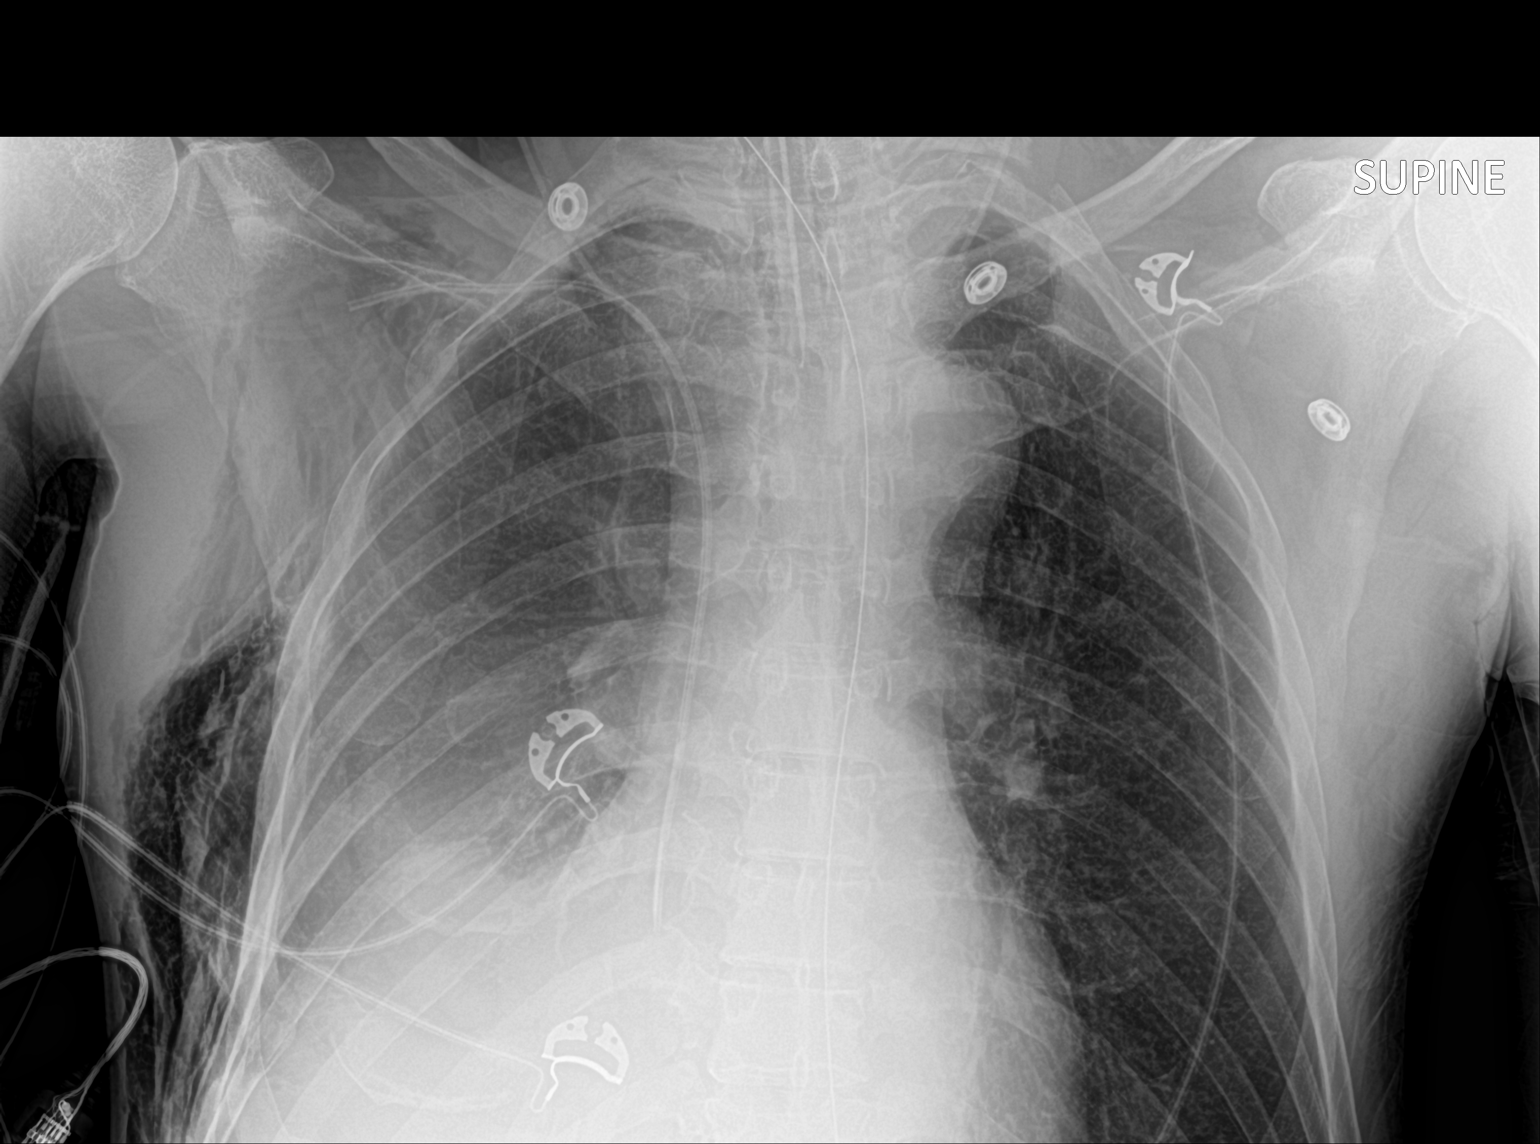

[1 of 1 positions shown; findings below may reference images not displayed]

FINDINGS: Right central line is been placed with the tip in the right atrium.
No pneumothorax. Endotracheal tube and NG tube are unchanged. Stable
subcutaneous air throughout the right chest wall. Layering right
effusion with right base atelectasis or infiltrate, unchanged. Left
lung clear. Heart is normal size.
IMPRESSION: Right central line tip in the upper right atrium. No visible
pneumothorax.

Stable exam otherwise.

## 2020-12-08 IMAGING — DX DG CHEST 1V PORT
1 series · 1 of 1 positions shown · non-contrast
Comparison: Chest x-ray obtained earlier today at [DATE] a.m.

CLINICAL DATA: Hypoxia

EXAM:
PORTABLE CHEST 1 VIEW

[chest]
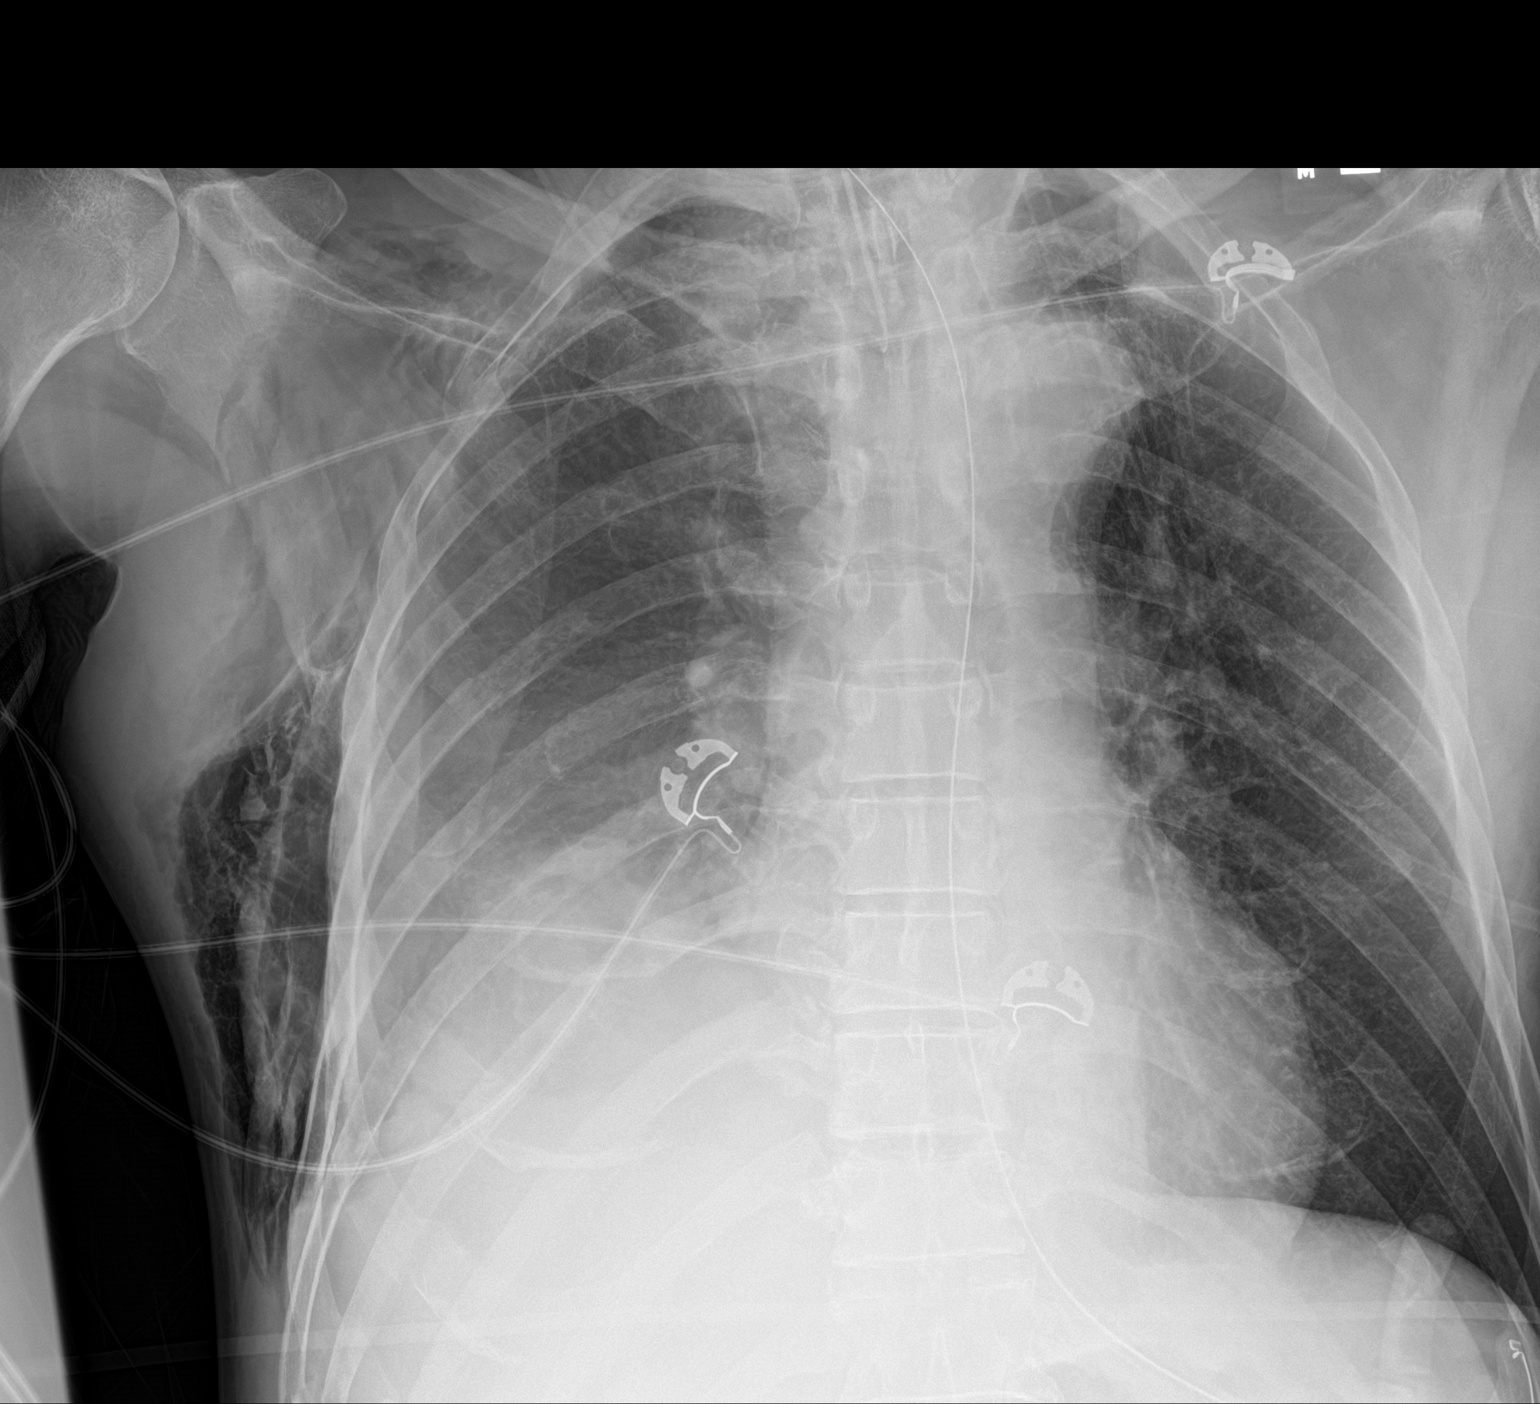

[1 of 1 positions shown; findings below may reference images not displayed]

FINDINGS: The endotracheal tube is 5.2 cm above the carina. A gastric tube is
present. The tip lies off the field of view, below the diaphragm.
Persistent extensive subcutaneous emphysema along the right chest
wall. Slightly increased right-sided pleural effusion filling the
pleural space where the pneumothorax was previously. Right-sided rib
fractures again noted. The left lung remains clear and well aerated.
IMPRESSION: 1. Stable and satisfactory position of support apparatus.
2. Slightly increased right-sided pleural effusion occupying the
space of the recently noted pneumothorax. Otherwise, no significant
interval change in the appearance of the chest.
3. Persistent subcutaneous emphysema throughout the soft tissues of
the right chest wall.

## 2020-12-08 SURGERY — AMPUTATION, ABOVE KNEE
Anesthesia: General | Site: Knee | Laterality: Bilateral

## 2020-12-08 MED ORDER — SODIUM CHLORIDE 0.9% IV SOLUTION: Freq: Once | INTRAVENOUS | Status: DC

## 2020-12-08 MED ORDER — MIDAZOLAM HCL 2 MG/2ML IJ SOLN
2.0000 mg | Freq: Once | INTRAMUSCULAR | Status: AC
  Administered 2020-12-08: 2 mg via INTRAVENOUS
  Filled 2020-12-08: qty 2

## 2020-12-08 MED ORDER — VANCOMYCIN HCL 750 MG/150ML IV SOLN: 750.0000 mg | INTRAVENOUS | Status: DC

## 2020-12-08 MED ORDER — ORAL CARE MOUTH RINSE
15.0000 mL | OROMUCOSAL | Status: DC
  Administered 2020-12-08 – 2020-12-09 (×15): 15 mL via OROMUCOSAL

## 2020-12-08 MED ORDER — STERILE WATER FOR INJECTION IV SOLN
INTRAVENOUS | Status: DC
  Filled 2020-12-08 (×6): qty 150

## 2020-12-08 MED ORDER — FENTANYL 2500MCG IN NS 250ML (10MCG/ML) PREMIX INFUSION
0.0000 ug/h | INTRAVENOUS | Status: DC
  Administered 2020-12-08: 25 ug/h via INTRAVENOUS
  Administered 2020-12-09: 200 ug/h via INTRAVENOUS
  Filled 2020-12-08: qty 250

## 2020-12-08 MED ORDER — ALTEPLASE 2 MG IJ SOLR: 2.0000 mg | Freq: Once | INTRAMUSCULAR | Status: DC | PRN

## 2020-12-08 MED ORDER — LACTATED RINGERS IV BOLUS
1000.0000 mL | Freq: Once | INTRAVENOUS | Status: AC
  Administered 2020-12-08: 1000 mL via INTRAVENOUS

## 2020-12-08 MED ORDER — SODIUM CHLORIDE 0.9 % IV SOLN: INTRAVENOUS | Status: DC | PRN

## 2020-12-08 MED ORDER — VECURONIUM BROMIDE 10 MG IV SOLR
10.0000 mg | Freq: Once | INTRAVENOUS | Status: AC
  Administered 2020-12-08: 10 mg via INTRAVENOUS
  Filled 2020-12-08: qty 10

## 2020-12-08 MED ORDER — CHLORHEXIDINE GLUCONATE CLOTH 2 % EX PADS
6.0000 | MEDICATED_PAD | Freq: Every day | CUTANEOUS | Status: DC
  Administered 2020-12-08: 6 via TOPICAL

## 2020-12-08 MED ORDER — DEXTROSE 50 % IV SOLN
1.0000 | Freq: Once | INTRAVENOUS | Status: AC
  Administered 2020-12-08: 50 mL via INTRAVENOUS

## 2020-12-08 MED ORDER — SODIUM CHLORIDE 0.9 % FOR CRRT: INTRAVENOUS_CENTRAL | Status: DC | PRN

## 2020-12-08 MED ORDER — FENTANYL CITRATE (PF) 100 MCG/2ML IJ SOLN
50.0000 ug | INTRAMUSCULAR | Status: DC | PRN
  Administered 2020-12-08: 50 ug via INTRAVENOUS
  Administered 2020-12-08: 100 ug via INTRAVENOUS
  Filled 2020-12-08: qty 2

## 2020-12-08 MED ORDER — SODIUM BICARBONATE 8.4 % IV SOLN
50.0000 meq | Freq: Once | INTRAVENOUS | Status: AC
  Administered 2020-12-08: 50 meq via INTRAVENOUS

## 2020-12-08 MED ORDER — SODIUM CHLORIDE 0.9 % IV SOLN
2.0000 g | Freq: Two times a day (BID) | INTRAVENOUS | Status: DC
  Administered 2020-12-09: 2 g via INTRAVENOUS
  Filled 2020-12-08: qty 2

## 2020-12-08 MED ORDER — VASOPRESSIN 20 UNITS/100 ML INFUSION FOR SHOCK
0.0000 [IU]/min | INTRAVENOUS | Status: DC
  Administered 2020-12-08 – 2020-12-09 (×2): 0.03 [IU]/min via INTRAVENOUS
  Filled 2020-12-08 (×2): qty 100

## 2020-12-08 MED ORDER — ARTIFICIAL TEARS OPHTHALMIC OINT
TOPICAL_OINTMENT | OPHTHALMIC | Status: DC
  Filled 2020-12-08: qty 3.5

## 2020-12-08 MED ORDER — DEXTROSE 50 % IV SOLN
INTRAVENOUS | Status: AC
  Filled 2020-12-08: qty 50

## 2020-12-08 MED ORDER — CALCIUM GLUCONATE-NACL 2-0.675 GM/100ML-% IV SOLN
2.0000 g | Freq: Once | INTRAVENOUS | Status: AC
  Administered 2020-12-08: 2000 mg via INTRAVENOUS
  Filled 2020-12-08: qty 100

## 2020-12-08 MED ORDER — NOREPINEPHRINE 4 MG/250ML-% IV SOLN
0.0000 ug/min | INTRAVENOUS | Status: DC
  Administered 2020-12-08: 24 ug/min via INTRAVENOUS
  Administered 2020-12-08: 12 ug/min via INTRAVENOUS
  Administered 2020-12-08: 2 ug/min via INTRAVENOUS
  Administered 2020-12-09: 30 ug/min via INTRAVENOUS
  Administered 2020-12-09: 14 ug/min via INTRAVENOUS
  Administered 2020-12-09: 40 ug/min via INTRAVENOUS
  Administered 2020-12-09: 14 ug/min via INTRAVENOUS
  Filled 2020-12-08 (×6): qty 250

## 2020-12-08 MED ORDER — SODIUM BICARBONATE 8.4 % IV SOLN
INTRAVENOUS | Status: AC
  Administered 2020-12-08: 50 meq
  Filled 2020-12-08: qty 50

## 2020-12-08 MED ORDER — CHLORHEXIDINE GLUCONATE 0.12% ORAL RINSE (MEDLINE KIT)
15.0000 mL | Freq: Two times a day (BID) | OROMUCOSAL | Status: DC
  Administered 2020-12-08 – 2020-12-09 (×4): 15 mL via OROMUCOSAL

## 2020-12-08 MED ORDER — STERILE WATER FOR INJECTION IV SOLN
INTRAVENOUS | Status: DC
  Filled 2020-12-08 (×5): qty 150

## 2020-12-08 MED ORDER — SODIUM BICARBONATE 8.4 % IV SOLN
INTRAVENOUS | Status: AC
  Filled 2020-12-08: qty 50

## 2020-12-08 MED ORDER — INSULIN ASPART 100 UNIT/ML IV SOLN
10.0000 [IU] | Freq: Once | INTRAVENOUS | Status: AC
  Administered 2020-12-08: 10 [IU] via INTRAVENOUS

## 2020-12-08 MED ORDER — SODIUM BICARBONATE 8.4 % IV SOLN
50.0000 meq | Freq: Once | INTRAVENOUS | Status: AC
  Administered 2020-12-08: 50 meq via INTRAVENOUS
  Filled 2020-12-08: qty 50

## 2020-12-08 MED ORDER — SODIUM BICARBONATE 8.4 % IV SOLN: 50.0000 meq | Freq: Once | INTRAVENOUS | Status: AC

## 2020-12-08 MED ORDER — MIDAZOLAM HCL 2 MG/2ML IJ SOLN
2.0000 mg | INTRAMUSCULAR | Status: DC | PRN
Start: 2020-12-08 — End: 2020-12-08

## 2020-12-08 MED ORDER — PRISMASOL BGK 0/2.5 32-2.5 MEQ/L EC SOLN
Status: DC
  Filled 2020-12-08 (×11): qty 5000

## 2020-12-08 MED ORDER — SODIUM CHLORIDE 0.9% FLUSH
10.0000 mL | Freq: Two times a day (BID) | INTRAVENOUS | Status: DC
  Administered 2020-12-08 (×2): 10 mL

## 2020-12-08 MED ORDER — MUPIROCIN 2 % EX OINT
1.0000 "application " | TOPICAL_OINTMENT | Freq: Two times a day (BID) | CUTANEOUS | Status: DC
  Administered 2020-12-08: 1 via NASAL
  Filled 2020-12-08: qty 22

## 2020-12-08 MED ORDER — SODIUM CHLORIDE 0.9% FLUSH: 10.0000 mL | INTRAVENOUS | Status: DC | PRN

## 2020-12-08 MED ORDER — CALCIUM GLUCONATE-NACL 1-0.675 GM/50ML-% IV SOLN
1.0000 g | Freq: Once | INTRAVENOUS | Status: AC
  Administered 2020-12-08: 1000 mg via INTRAVENOUS
  Filled 2020-12-08: qty 50

## 2020-12-08 MED ORDER — SODIUM BICARBONATE 8.4 % IV SOLN
INTRAVENOUS | Status: DC
  Filled 2020-12-08 (×2): qty 850

## 2020-12-08 MED ORDER — MIDAZOLAM 50MG/50ML (1MG/ML) PREMIX INFUSION
0.0000 mg/h | INTRAVENOUS | Status: DC
  Administered 2020-12-08 – 2020-12-09 (×2): 2 mg/h via INTRAVENOUS
  Filled 2020-12-08 (×2): qty 50

## 2020-12-08 MED ORDER — SODIUM CHLORIDE 0.9 % IV BOLUS
1000.0000 mL | Freq: Once | INTRAVENOUS | Status: AC
  Administered 2020-12-08: 1000 mL via INTRAVENOUS

## 2020-12-08 MED ORDER — MIDAZOLAM HCL 2 MG/2ML IJ SOLN
INTRAMUSCULAR | Status: AC
  Administered 2020-12-08: 2 mg via INTRAVENOUS
  Filled 2020-12-08: qty 2

## 2020-12-08 MED ORDER — SODIUM CHLORIDE 0.9% IV SOLUTION: Freq: Once | INTRAVENOUS | Status: AC

## 2020-12-08 MED ORDER — MIDAZOLAM HCL 2 MG/2ML IJ SOLN: 2.0000 mg | INTRAMUSCULAR | Status: DC | PRN

## 2020-12-08 MED ORDER — SODIUM CHLORIDE 0.9 % IV BOLUS
2000.0000 mL | Freq: Once | INTRAVENOUS | Status: AC
  Administered 2020-12-08: 2000 mL via INTRAVENOUS

## 2020-12-08 MED ORDER — ASPIRIN 81 MG PO CHEW
81.0000 mg | CHEWABLE_TABLET | Freq: Every day | ORAL | Status: DC
  Administered 2020-12-08: 81 mg
  Filled 2020-12-08: qty 1

## 2020-12-08 MED ORDER — VANCOMYCIN HCL 1500 MG/300ML IV SOLN
1500.0000 mg | Freq: Once | INTRAVENOUS | Status: AC
  Administered 2020-12-09: 1500 mg via INTRAVENOUS
  Filled 2020-12-08: qty 300

## 2020-12-08 MED ORDER — NOREPINEPHRINE 4 MG/250ML-% IV SOLN
INTRAVENOUS | Status: AC
  Filled 2020-12-08: qty 250

## 2020-12-08 MED ORDER — MIDAZOLAM BOLUS VIA INFUSION
1.0000 mg | INTRAVENOUS | Status: DC | PRN
  Filled 2020-12-08: qty 2

## 2020-12-08 MED ORDER — HEPARIN SODIUM (PORCINE) 1000 UNIT/ML DIALYSIS
1000.0000 [IU] | INTRAMUSCULAR | Status: DC | PRN
  Filled 2020-12-08: qty 6

## 2020-12-08 NOTE — Progress Notes (Signed)
Patient arrived to 4NICU and only belonging at bedside is 1 black watch. Still a GCS of 3 on no sedation but does have cough and corneals. CDS referral # [id number]
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
## 2020-12-08 NOTE — Progress Notes (Signed)
VASCULAR LAB    Bilateral lower extremity arterial duplex has been performed.  See CV proc for preliminary results.  Gave verbal report to Dr. Darrick Penna and messaged Dr. Freida Busman via Epic with results.  Shawntay Prest, RVT 01/01/2021, 12:48 PM
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
## 2020-12-08 NOTE — Procedures (Signed)
Central line  Date/Time: 01-04-2021 7:18 PM Performed by: Fritzi Mandes, MD Authorized by: Fritzi Mandes, MD   Consent:    Consent obtained:  Verbal   Consent given by: brother. 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 Subclavian access attempted but wire would not thread. US-guided access to L IJ successfully obtained and wire threaded without resistance. Wire placement into the vein was confirmed with [place].
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
## 2020-12-08 NOTE — Progress Notes (Signed)
RT transported patient from MRI TO 4N without any complications.

## 2020-12-08 NOTE — Progress Notes (Signed)
Dr Royanne Foots paged regarding decreased urine output to 5-16ml/hr. No new orders received yet.
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
## 2020-12-08 NOTE — Progress Notes (Signed)
  Echocardiogram 2D Echocardiogram limited has been performed.  Leta Jungling M 12/31/2020, 12:47 PM

## 2020-12-08 NOTE — Progress Notes (Addendum)
Patient has become progressively more ill during the course of the day today.  Progressively more acidotic thought to be secondary to bilateral ischemic extremities.  Consideration was given for bilateral above knee amputations.  He is currently too ill to consider any revascularization.  However, the patient has had progressive renal failure and hypoxia.  He most likely has had a pulmonary embolus.  He now is having shock liver.  He still has had no return of neurologic function.  If he becomes more stable we could consider doing bilateral above-knee amputations.  However, I believe this patient's quality of life would be severely diminished with his severe head injury renal failure and bilateral above-knee amputations.  I have discussed this with Dr. Freida Busman from the trauma service who also agrees.  The family currently wishes to continue with maximal support.  If he continues to improve we could reconsider whether or not to do above-knee amputations but again I think quality of life needs to be factored into this equation.  We will continue to follow.  Patient's overall prognosis is quite poor.  Fabienne Bruns, MD [facility] Office: [phone]

## 2020-12-08 NOTE — Procedures (Signed)
Procedure Note  Date: 12/24/2020  Procedure: central venous catheter placement--right, subclavian vein, without ultrasound guidance  Pre-op diagnosis: hypotension, need for administration of vasopressors Post-op diagnosis: same  Surgeon: Jesusita Oka, MD  Anesthesia: local  EBL: <5cc Drains/Implants: 16 cm, triple lumen central venous catheter  Description of procedure: Time-out was performed verifying correct patient, procedure, site, laterality, and signature of informed consent. The right upper chest was prepped and draped in the usual sterile fashion. The right subclavian vein was accessed using an introducer needle and a guidewire passed through the needle. The needle was removed and a skin nick was made. The tract was dilated and the central venous catheter advanced over the guidewire followed by removal of the guidewire. All ports drew blood easily and all were flushed with saline. The catheter was secured to the skin with suture and a sterile dressing. The patient tolerated the procedure well. There were no immediate complications. Follow up chest x-ray was ordered to confirm positioning and the absence of a pneumothorax.     Procedure: arterial line placement--left, radial artery, with ultrasound guidance  Pre-op diagnosis: hypotension, need for invasive blood pressure monitoring Post-op diagnosis: same  Surgeon: Jesusita Oka, MD  Anesthesia: none  EBL: <5cc Drains/Implants: 4 cm arterial catheter  Description of procedure: Time-out was performed verifying correct patient, procedure, site, laterality, and signature of informed consent. The left wrist was prepped and draped in the usual sterile fashion. The artery was localized with ultrasound guidance and accessed using an arterial catheterization kit after 3 attempt(s). The wire was advanced and the sheath advanced over the wire. The needle and wire were removed with pulsatile, bright red blood noted through the  catheter. The catheter was connected to a transducer and a good waveform was noted. The catheter was secured in place with suture and tegaderm. The patient tolerated the procedure well. There were no complications.    Jesusita Oka, MD General and [place] [facility]

## 2020-12-08 NOTE — Progress Notes (Signed)
° °  Subjective:  Patient remains intubated and unresponsive and withdraws to painful stimulus only to the left upper extremity.  Patient is not under any sedation.  Objective:   VITALS:   Vitals:   2020-12-25 0900 2020/12/25 1000 12/25/20 1100 2020/12/25 1108  BP: 130/70 109/74 95/67   Pulse:  (!) 119 (!) 122 (!) 114  Resp: (!) 33 (!) 29 (!) 32 (!) 33  Temp: (!) 100.58 F (38.1 C) 100.04 F (37.8 C) 99.68 F (37.6 C)   TempSrc:      SpO2:  (!) 80% (!) 86% (!) 87%  Weight:      Height:        Right arm coaptation splint is well fitting.  Right hand is warm and well-perfused.  2+ radial pulse.  Left lower extremity splint has been removed for Doppler evaluation.  Open fracture wound is hemostatic.  Compartments remain soft and compressible.  No dopplerable or palpable pulses.  Foot is cool to touch.  Right foot is cool to touch.  Pelvic binder in place.  Patient is unresponsive.  Unable to perform neuro exam.   Lab Results  Component Value Date   WBC 11.9 (H) 12/25/2020   HGB 10.2 (L) Dec 25, 2020   HCT 30.0 (L) 25-Dec-2020   MCV 88.8 2020-12-25   PLT 127 (L) 12/25/20     Assessment/Plan:  Patient has a significant brain and head injury and prognosis is guarded at this time.  This will likely determine how aggressive we need to be from an orthopedic standpoint.  His orthopedic injuries are stable and did not need any intervention at this time.  I agree with Dr. Darrick Penna that the lack of perfusion to his feet are likely due to hypovolemia and shock.  Arterial studies have been ordered to further evaluate.  The orthopedic team will follow along.  Glee Arvin 12/25/2020, 11:51 AM [phone]

## 2020-12-08 DEATH — deceased

## 2020-12-09 ENCOUNTER — Inpatient Hospital Stay (HOSPITAL_COMMUNITY): Payer: Medicaid Other

## 2020-12-09 DIAGNOSIS — N179 Acute kidney failure, unspecified: Secondary | ICD-10-CM

## 2020-12-09 DIAGNOSIS — Z515 Encounter for palliative care: Secondary | ICD-10-CM

## 2020-12-09 DIAGNOSIS — T07XXXA Unspecified multiple injuries, initial encounter: Secondary | ICD-10-CM

## 2020-12-09 DIAGNOSIS — Z7189 Other specified counseling: Secondary | ICD-10-CM

## 2020-12-09 LAB — POCT I-STAT 7, (LYTES, BLD GAS, ICA,H+H)
Acid-base deficit: 3 mmol/L — ABNORMAL HIGH (ref 0.0–2.0)
Acid-base deficit: 5 mmol/L — ABNORMAL HIGH (ref 0.0–2.0)
Bicarbonate: 20.2 mmol/L (ref 20.0–28.0)
Bicarbonate: 22.3 mmol/L (ref 20.0–28.0)
Calcium, Ion: 0.81 mmol/L — CL (ref 1.15–1.40)
Calcium, Ion: 0.87 mmol/L — CL (ref 1.15–1.40)
HCT: 33 % — ABNORMAL LOW (ref 39.0–52.0)
HCT: 35 % — ABNORMAL LOW (ref 39.0–52.0)
Hemoglobin: 11.2 g/dL — ABNORMAL LOW (ref 13.0–17.0)
Hemoglobin: 11.9 g/dL — ABNORMAL LOW (ref 13.0–17.0)
O2 Saturation: 96 %
O2 Saturation: 99 %
Patient temperature: 34.8
Patient temperature: 36.2
Potassium: 4.7 mmol/L (ref 3.5–5.1)
Potassium: 5.3 mmol/L — ABNORMAL HIGH (ref 3.5–5.1)
Sodium: 134 mmol/L — ABNORMAL LOW (ref 135–145)
Sodium: 137 mmol/L (ref 135–145)
TCO2: 21 mmol/L — ABNORMAL LOW (ref 22–32)
TCO2: 24 mmol/L (ref 22–32)
pCO2 arterial: 35.2 mmHg (ref 32.0–48.0)
pCO2 arterial: 36.1 mmHg (ref 32.0–48.0)
pH, Arterial: 7.352 (ref 7.350–7.450)
pH, Arterial: 7.401 (ref 7.350–7.450)
pO2, Arterial: 108 mmHg (ref 83.0–108.0)
pO2, Arterial: 80 mmHg — ABNORMAL LOW (ref 83.0–108.0)

## 2020-12-09 LAB — RENAL FUNCTION PANEL
Albumin: 1.8 g/dL — ABNORMAL LOW (ref 3.5–5.0)
Anion gap: 19 — ABNORMAL HIGH (ref 5–15)
BUN: 13 mg/dL (ref 8–23)
CO2: 20 mmol/L — ABNORMAL LOW (ref 22–32)
Calcium: 5.9 mg/dL — CL (ref 8.9–10.3)
Chloride: 98 mmol/L (ref 98–111)
Creatinine, Ser: 2.45 mg/dL — ABNORMAL HIGH (ref 0.61–1.24)
GFR, Estimated: 29 mL/min — ABNORMAL LOW (ref >60)
Glucose, Bld: 137 mg/dL — ABNORMAL HIGH (ref 70–99)
Phosphorus: 6.2 mg/dL — ABNORMAL HIGH (ref 2.5–4.6)
Potassium: 4.8 mmol/L (ref 3.5–5.1)
Sodium: 137 mmol/L (ref 135–145)

## 2020-12-09 LAB — CBC
HCT: 34.2 % — ABNORMAL LOW (ref 39.0–52.0)
Hemoglobin: 12.3 g/dL — ABNORMAL LOW (ref 13.0–17.0)
MCH: 30.4 pg (ref 26.0–34.0)
MCHC: 36 g/dL (ref 30.0–36.0)
MCV: 84.7 fL (ref 80.0–100.0)
Platelets: 59 10*3/uL — ABNORMAL LOW (ref 150–400)
RBC: 4.04 MIL/uL — ABNORMAL LOW (ref 4.22–5.81)
RDW: 15.3 % (ref 11.5–15.5)
WBC: 10.5 10*3/uL (ref 4.0–10.5)
nRBC: 0.4 % — ABNORMAL HIGH (ref 0.0–0.2)

## 2020-12-09 LAB — MAGNESIUM: Magnesium: 1.7 mg/dL (ref 1.7–2.4)

## 2020-12-09 LAB — APTT: aPTT: 46 seconds — ABNORMAL HIGH (ref 24–36)

## 2020-12-09 LAB — BLOOD PRODUCT ORDER (VERBAL) VERIFICATION

## 2020-12-09 LAB — CALCIUM, IONIZED: Calcium, Ionized, Serum: 3.2 mg/dL — ABNORMAL LOW (ref 4.5–5.6)

## 2020-12-09 IMAGING — DX DG CHEST 1V PORT
1 series · 2 of 2 positions shown · non-contrast
Comparison: [DATE] [DATE], [DATE] [DATE] a.m.

CLINICAL DATA: Respiratory failure.

EXAM:
PORTABLE CHEST 1 VIEW

[Series 1: chest ap · 0.14mm/px · 2 of 2 slices shown]
[im 1/2]
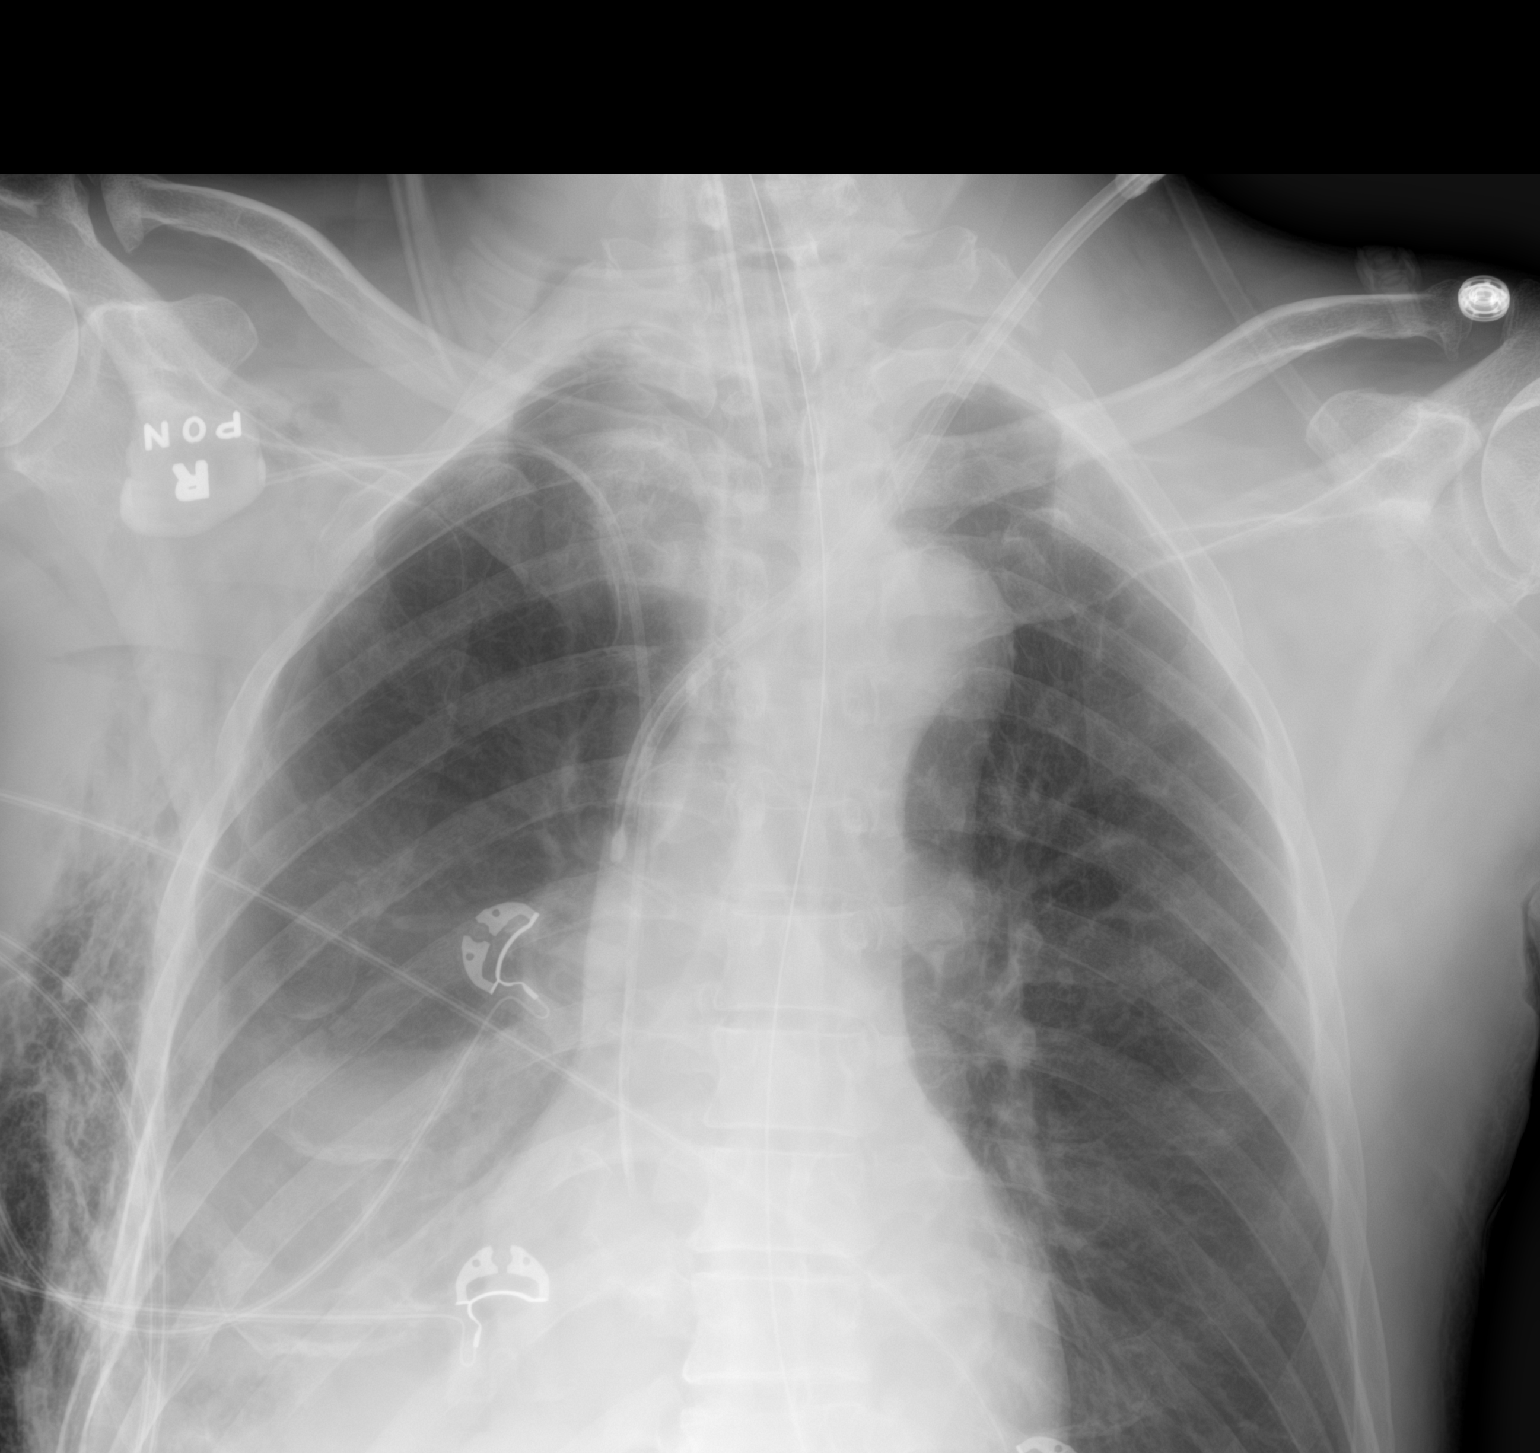
[im 2/2]
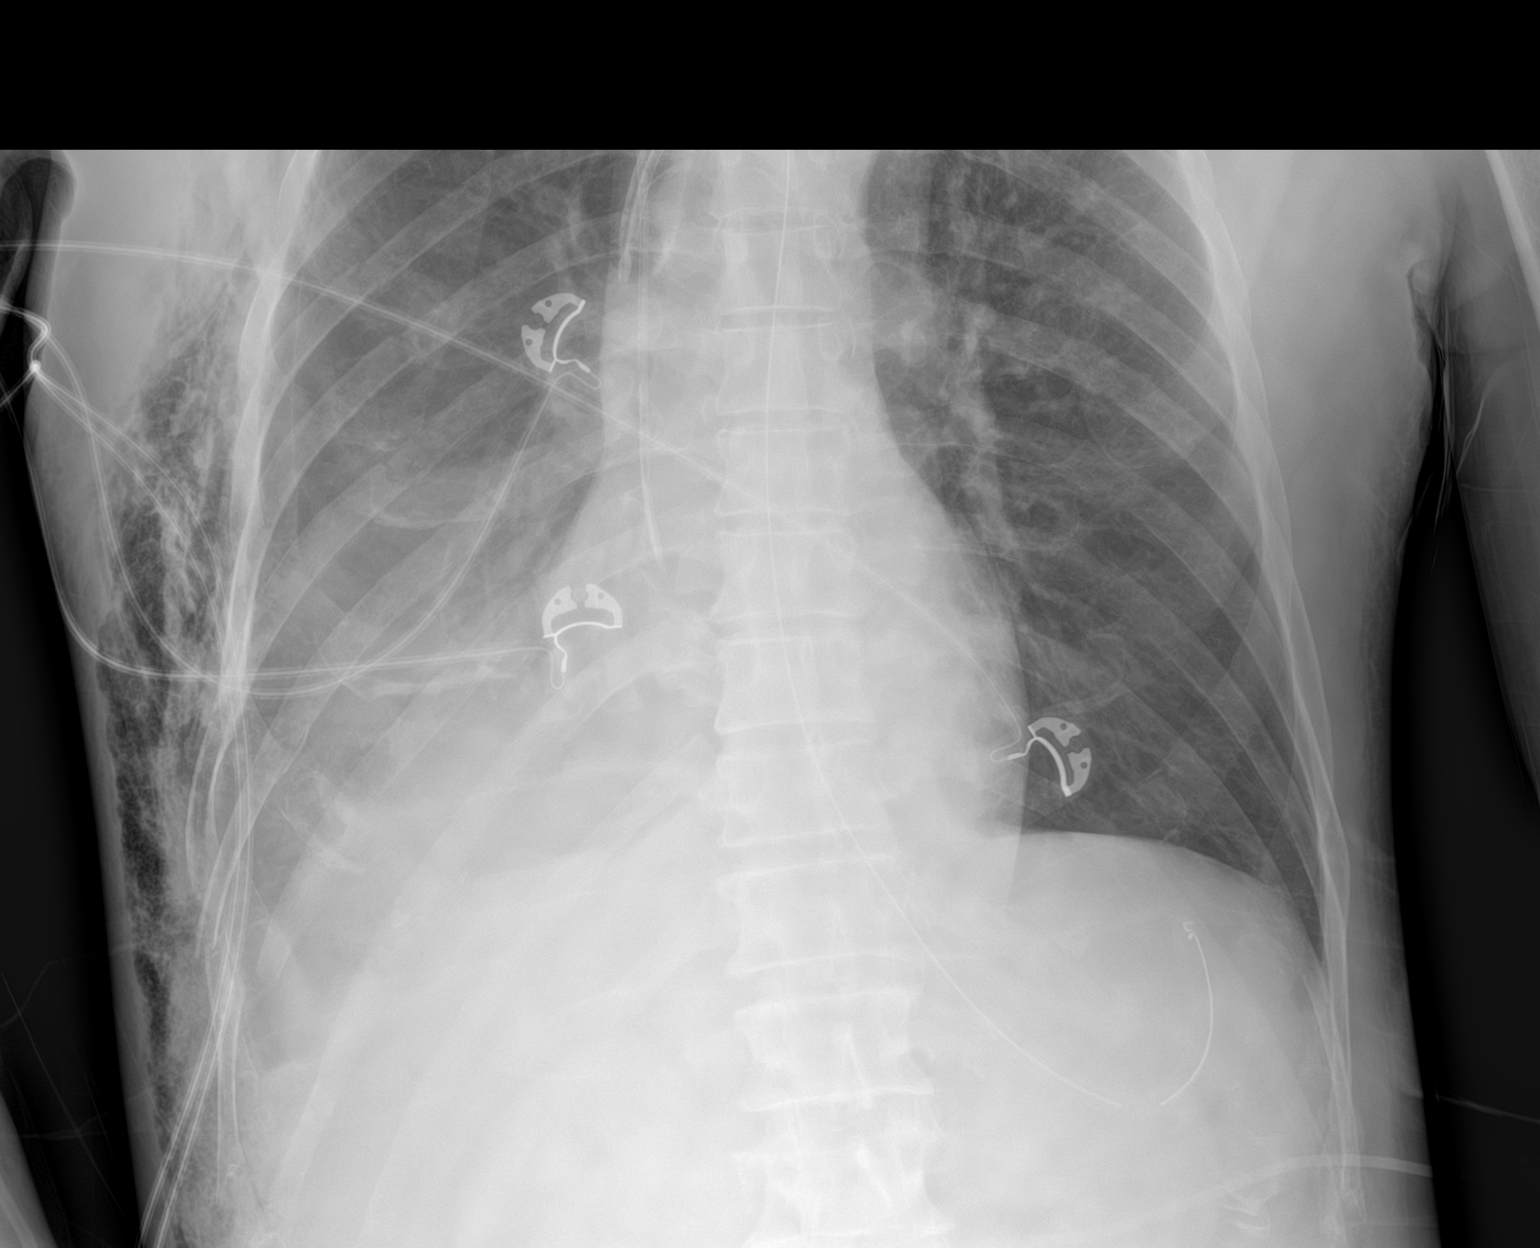

[2 of 2 positions shown; findings below may reference images not displayed]

FINDINGS: The mediastinal contour and cardiac silhouette are stable.
Endotracheal tube, bilateral central venous line, nasogastric tube
are stable. Minimal right apical pneumothorax is noted.
Consolidation of the right mid lung and right lung base are
identified. The right upper lobe is better aerated compared to prior
exam. Right chest subcutaneous air is identified unchanged.
IMPRESSION: Consolidation of the right mid lung and right lung base, consistent
with pneumonia. The right upper lobe is better aerated compared to
prior exam.

Minimal right apical pneumothorax.

These results will be called to the ordering clinician or
representative by the Radiologist Assistant, and communication
documented in the PACS or [REDACTED].

## 2020-12-09 MED ORDER — CALCIUM GLUCONATE-NACL 1-0.675 GM/50ML-% IV SOLN
1.0000 g | Freq: Once | INTRAVENOUS | Status: AC
  Administered 2020-12-09: 1000 mg via INTRAVENOUS
  Filled 2020-12-09: qty 50

## 2020-12-09 MED ORDER — PRISMASOL BGK 0/2.5 32-2.5 MEQ/L EC SOLN
Status: DC
  Filled 2020-12-09 (×6): qty 5000

## 2020-12-09 MED ORDER — EPINEPHRINE HCL 5 MG/250ML IV SOLN IN NS
INTRAVENOUS | Status: AC
  Filled 2020-12-09: qty 250

## 2020-12-09 MED ORDER — PRISMASOL BGK 4/2.5 32-4-2.5 MEQ/L EC SOLN
Status: DC
  Filled 2020-12-09 (×6): qty 5000

## 2020-12-09 MED ORDER — MIDAZOLAM BOLUS VIA INFUSION
2.0000 mg | INTRAVENOUS | Status: DC | PRN
  Filled 2020-12-09: qty 2

## 2020-12-09 MED ORDER — PRISMASOL BGK 4/2.5 32-4-2.5 MEQ/L REPLACEMENT SOLN
Status: DC
  Filled 2020-12-09 (×2): qty 5000

## 2020-12-09 MED ORDER — EPINEPHRINE HCL 5 MG/250ML IV SOLN IN NS
0.5000 ug/min | INTRAVENOUS | Status: DC
  Administered 2020-12-09: 0.5 ug/min via INTRAVENOUS

## 2020-12-09 MED ORDER — GLYCOPYRROLATE 1 MG PO TABS
1.0000 mg | ORAL_TABLET | ORAL | Status: DC | PRN
  Filled 2020-12-09: qty 1

## 2020-12-09 MED ORDER — GLYCOPYRROLATE 0.2 MG/ML IJ SOLN: 0.2000 mg | INTRAMUSCULAR | Status: DC | PRN

## 2020-12-09 MED ORDER — NOREPINEPHRINE 4 MG/250ML-% IV SOLN
INTRAVENOUS | Status: AC
  Filled 2020-12-09: qty 250

## 2020-12-09 MED ORDER — FENTANYL CITRATE (PF) 100 MCG/2ML IJ SOLN: 100.0000 ug | INTRAMUSCULAR | Status: DC | PRN

## 2020-12-09 MED ORDER — POLYVINYL ALCOHOL 1.4 % OP SOLN
1.0000 [drp] | Freq: Four times a day (QID) | OPHTHALMIC | Status: DC | PRN
  Filled 2020-12-09: qty 15

## 2020-12-09 MED ORDER — PANTOPRAZOLE SODIUM 40 MG IV SOLR: 40.0000 mg | Freq: Every day | INTRAVENOUS | Status: DC

## 2020-12-10 LAB — TYPE AND SCREEN
ABO/RH(D): A POS
Antibody Screen: NEGATIVE
Unit division: 0
Unit division: 0
Unit division: 0
Unit division: 0
Unit division: 0
Unit division: 0
Unit division: 0
Unit division: 0
Unit division: 0
Unit division: 0
Unit division: 0
Unit division: 0
Unit division: 0

## 2020-12-10 LAB — BPAM RBC
Blood Product Expiration Date: 202201162359
Blood Product Expiration Date: 202201222359
Blood Product Expiration Date: 202201232359
Blood Product Expiration Date: 202201242359
Blood Product Expiration Date: 202201242359
Blood Product Expiration Date: 202201242359
Blood Product Expiration Date: 202201242359
Blood Product Expiration Date: 202201242359
Blood Product Expiration Date: 202201282359
Blood Product Expiration Date: 202202032359
Blood Product Expiration Date: 202202032359
Blood Product Expiration Date: 202202032359
Blood Product Expiration Date: 202202032359
ISSUE DATE / TIME: 202112311904
ISSUE DATE / TIME: 202112311911
ISSUE DATE / TIME: 202112311911
ISSUE DATE / TIME: 202112311922
ISSUE DATE / TIME: 202112311922
ISSUE DATE / TIME: 202112311935
ISSUE DATE / TIME: 202112311940
ISSUE DATE / TIME: 202201011337
ISSUE DATE / TIME: 202201011337
ISSUE DATE / TIME: 202201011625
ISSUE DATE / TIME: 202201020028
ISSUE DATE / TIME: 202201020255
ISSUE DATE / TIME: 202201021027
Unit Type and Rh: 5100
Unit Type and Rh: 5100
Unit Type and Rh: 5100
Unit Type and Rh: 5100
Unit Type and Rh: 5100
Unit Type and Rh: 6200
Unit Type and Rh: 6200
Unit Type and Rh: 6200
Unit Type and Rh: 6200
Unit Type and Rh: 6200
Unit Type and Rh: 6200
Unit Type and Rh: 6200
Unit Type and Rh: 6200

## 2021-01-08 NOTE — Progress Notes (Signed)
Pt time of death 56. Pronounced by Thomes Cake RN and Dorthula Rue RN. Dr. Dossie Der notified. Pt will be ME case. Family at bedside at time of death.

## 2021-01-08 NOTE — Progress Notes (Addendum)
   Subjective:  Patient continues to decline medically. Bilateral AKAs cancelled yesterday. Started on CRRT.  Objective:   VITALS:   Vitals:   12-11-2020 1030 12/11/2020 1045 12-11-2020 1100 2020-12-11 1112  BP:   (!) 65/39   Pulse:    96  Resp: (!) 24 (!) 24 (!) 24 (!) 24  Temp: (!) 95.36 F (35.2 C) (!) 95.36 F (35.2 C) (!) 95.36 F (35.2 C)   TempSrc:      SpO2:    94%  Weight:      Height:       Ortho exam is stable BLE are cool RUE with splint and blisters   Lab Results  Component Value Date   WBC 10.5 12-11-20   HGB 12.3 (L) 12-11-2020   HCT 34.2 (L) December 11, 2020   MCV 84.7 12/11/2020   PLT 59 (L) 12/11/20     Assessment/Plan:   1. Open book pelvic fracture - in pelvic binder 2. Open right proximal humerus fracture - in coap splint 3. Open left tib fib fx 4. Multi organ failure  Patient has multi organ failure and continues to decline despite aggressive resuscitation efforts.  He is maxed out on pressors. Too unstable to undergo any orthopedic interventions nor would they be of any benefit or change outcome at this point. Family meeting scheduled imminently to discuss goals of care. Orthopedics signed off at this point.  Available for any questions.  Glee Arvin 12/11/20, 11:36 AM [phone]

## 2021-01-08 NOTE — Progress Notes (Signed)
Palliative-   Thank you for this consult-  GOC meeting planned for this afternoon at 1430 with the following family members:   Fransisco Hertz Patsy [address]  Ocie Bob, [place] [facility]  No charge

## 2021-01-08 NOTE — Progress Notes (Signed)
Patient terminally extubated per MD order. RN at bedside.

## 2021-01-08 NOTE — Progress Notes (Signed)
100 ml of fentanyl and 50 ml of versed wasted in sink with J Lam RN.

## 2021-01-08 NOTE — Progress Notes (Addendum)
Verbally notified Sophronia Simas, MD of critical Ca of 6.3 at bedside.  No new orders at this time

## 2021-01-08 NOTE — Progress Notes (Signed)
NEUROSURGERY PROGRESS NOTE  Patient intubated and sedated, no improvement neurologically. Continue flat bedrest with logroll. GOC meeting this afternoon.   Temp:  [94.28 F (34.6 C)-99.68 F (37.6 C)] 95.36 F (35.2 C) (01/02 0900) Pulse Rate:  [107-131] 109 (01/02 0741) Resp:  [21-58] 21 (01/02 0900) BP: (41-130)/(11-112) 106/72 (01/02 0900) SpO2:  [74 %-97 %] 97 % (01/02 0800) Arterial Line BP: (84-124)/(43-85) 87/70 (01/02 0900) FiO2 (%):  [100 %] 100 % (01/02 0741)   Sherryl Manges, NP 12/08/2020 10:25 AM
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
## 2021-01-08 NOTE — Progress Notes (Signed)
[facility]  Subjective  - no purposeful movement, on sedation, moved left arm some  Objective 106/72 (!) 109 (!) 95.36 F (35.2 C) (!) 21 97%  Intake/Output Summary (Last 24 hours) at 12/30/2020 0944 Last data filed at 12/28/2020 0900 Gross per 24 hour  Intake 10149.73 ml  Output 2850 ml  Net 7299.73 ml   Right leg mottled  Left thigh edema Neuro no obvious function  Assessment/Planning: Poor perfusion both lower extremities  Hypoperfusion  Renal failure  Severe head injury  Shock liver  Overall poor prognosis, legs not viable  Fabienne Bruns 12/21/2020 9:44 AM --  Laboratory Lab Results: Recent Labs    12/16/2020 2007 12/29/2020 0005 01/05/2021 0616 01/04/2021 0701  WBC 7.8  --   --  10.5  HGB 12.1*   < > 11.9* 12.3*  HCT 36.2*   < > 35.0* 34.2*  PLT 63*  --   --  59*   < > = values in this interval not displayed.   BMET Recent Labs    12/19/2020 2016 12/16/2020 0005 12/23/2020 0616 12/08/2020 0700  NA 139   < > 134* 137  K 5.4*   < > 4.7 4.8  CL 109  --   --  98  CO2 15*  --   --  20*  GLUCOSE 140*  --   --  137*  BUN 21  --   --  13  CREATININE 3.30*  --   --  2.45*  CALCIUM 6.3*  --   --  5.9*   < > = values in this interval not displayed.    COAG Lab Results  Component Value Date   INR 1.4 (H) 12/12/2020   INR 1.1 January 01, 2021   No results found for: PTT
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
## 2021-01-08 DEATH — deceased
